# Patient Record
Sex: Female | Born: 1970 | Race: White | Hispanic: No | Smoking: Never smoker
Health system: Southern US, Community
[De-identification: ages and names within clinical notes are randomized; demographics above are authoritative.]

## PROBLEM LIST (undated history)

## (undated) DIAGNOSIS — I10 Essential (primary) hypertension: Secondary | ICD-10-CM

## (undated) DIAGNOSIS — E039 Hypothyroidism, unspecified: Secondary | ICD-10-CM

## (undated) HISTORY — PX: DILITATION & CURRETTAGE/HYSTROSCOPY WITH HYDROTHERMAL ABLATION: SHX5570

## (undated) HISTORY — PX: EYE SURGERY: SHX253

## (undated) HISTORY — DX: Hypothyroidism, unspecified: E03.9

## (undated) HISTORY — PX: IRRIGATION AND DEBRIDEMENT FOOT: SHX6602

## (undated) HISTORY — DX: Essential (primary) hypertension: I10

## (undated) HISTORY — PX: BREAST EXCISIONAL BIOPSY: SUR124

---

## 1998-06-11 ENCOUNTER — Other Ambulatory Visit: Admission: RE | Admit: 1998-06-11 | Discharge: 1998-06-11 | Payer: Self-pay | Admitting: Obstetrics & Gynecology

## 1999-06-24 ENCOUNTER — Other Ambulatory Visit: Admission: RE | Admit: 1999-06-24 | Discharge: 1999-06-24 | Payer: Self-pay | Admitting: Obstetrics & Gynecology

## 2007-10-05 DIAGNOSIS — C439 Malignant melanoma of skin, unspecified: Secondary | ICD-10-CM

## 2007-10-05 HISTORY — DX: Malignant melanoma of skin, unspecified: C43.9

## 2008-02-22 ENCOUNTER — Emergency Department (HOSPITAL_COMMUNITY): Admission: EM | Admit: 2008-02-22 | Discharge: 2008-02-22 | Payer: Self-pay | Admitting: Family Medicine

## 2012-03-22 ENCOUNTER — Other Ambulatory Visit: Payer: Self-pay | Admitting: Family Medicine

## 2012-03-22 DIAGNOSIS — C6990 Malignant neoplasm of unspecified site of unspecified eye: Secondary | ICD-10-CM

## 2012-03-28 ENCOUNTER — Ambulatory Visit
Admission: RE | Admit: 2012-03-28 | Discharge: 2012-03-28 | Disposition: A | Discharge status: Home / Self Care | Payer: BC Managed Care – PPO | Source: Ambulatory Visit | Attending: Family Medicine | Admitting: Family Medicine

## 2012-03-28 DIAGNOSIS — C6990 Malignant neoplasm of unspecified site of unspecified eye: Secondary | ICD-10-CM

## 2012-03-28 MED ORDER — GADOBENATE DIMEGLUMINE 529 MG/ML IV SOLN: 10.0000 mL | Freq: Once | INTRAVENOUS | Status: DC | PRN

## 2012-03-28 MED ORDER — GADOXETATE DISODIUM 0.25 MMOL/ML IV SOLN: 10.0000 mL | Freq: Once | INTRAVENOUS | Status: AC | PRN

## 2013-06-07 ENCOUNTER — Other Ambulatory Visit: Payer: Self-pay | Admitting: Obstetrics & Gynecology

## 2013-06-07 DIAGNOSIS — Z803 Family history of malignant neoplasm of breast: Secondary | ICD-10-CM

## 2014-08-16 ENCOUNTER — Other Ambulatory Visit: Payer: Self-pay | Admitting: Obstetrics & Gynecology

## 2014-08-16 DIAGNOSIS — R928 Other abnormal and inconclusive findings on diagnostic imaging of breast: Secondary | ICD-10-CM

## 2014-09-02 ENCOUNTER — Ambulatory Visit
Admission: RE | Admit: 2014-09-02 | Discharge: 2014-09-02 | Disposition: A | Discharge status: Home / Self Care | Payer: BC Managed Care – PPO | Source: Ambulatory Visit | Attending: Obstetrics & Gynecology | Admitting: Obstetrics & Gynecology

## 2014-09-02 DIAGNOSIS — R928 Other abnormal and inconclusive findings on diagnostic imaging of breast: Secondary | ICD-10-CM

## 2014-11-11 DIAGNOSIS — D229 Melanocytic nevi, unspecified: Secondary | ICD-10-CM

## 2014-11-11 HISTORY — DX: Melanocytic nevi, unspecified: D22.9

## 2015-09-07 IMAGING — MG MM DIGITAL DIAGNOSTIC BILAT
5 series · 5 of 5 positions shown · non-contrast
Comparison: With priors.

CLINICAL DATA: Patient was called back from screening mammogram for
possible masses in both breast.

EXAM:
DIGITAL DIAGNOSTIC  BILATERAL MAMMOGRAM WITH CAD
ULTRASOUND BILATERAL BREAST

[R MLO]
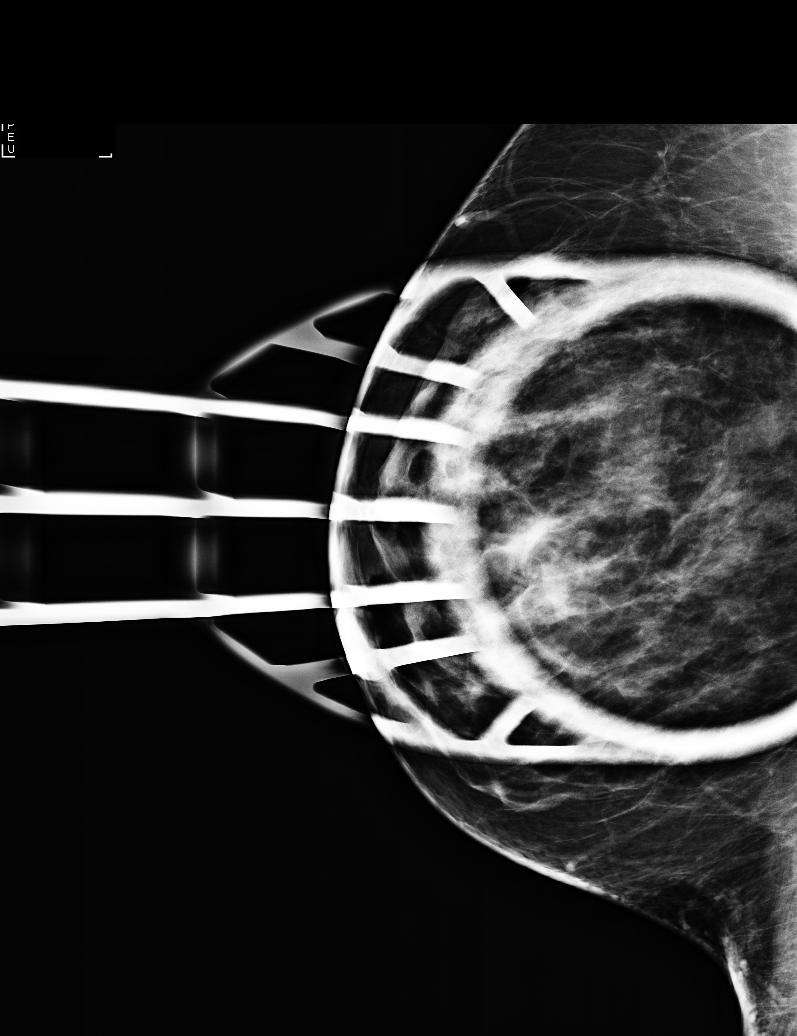

[R CC (1 of 2)]
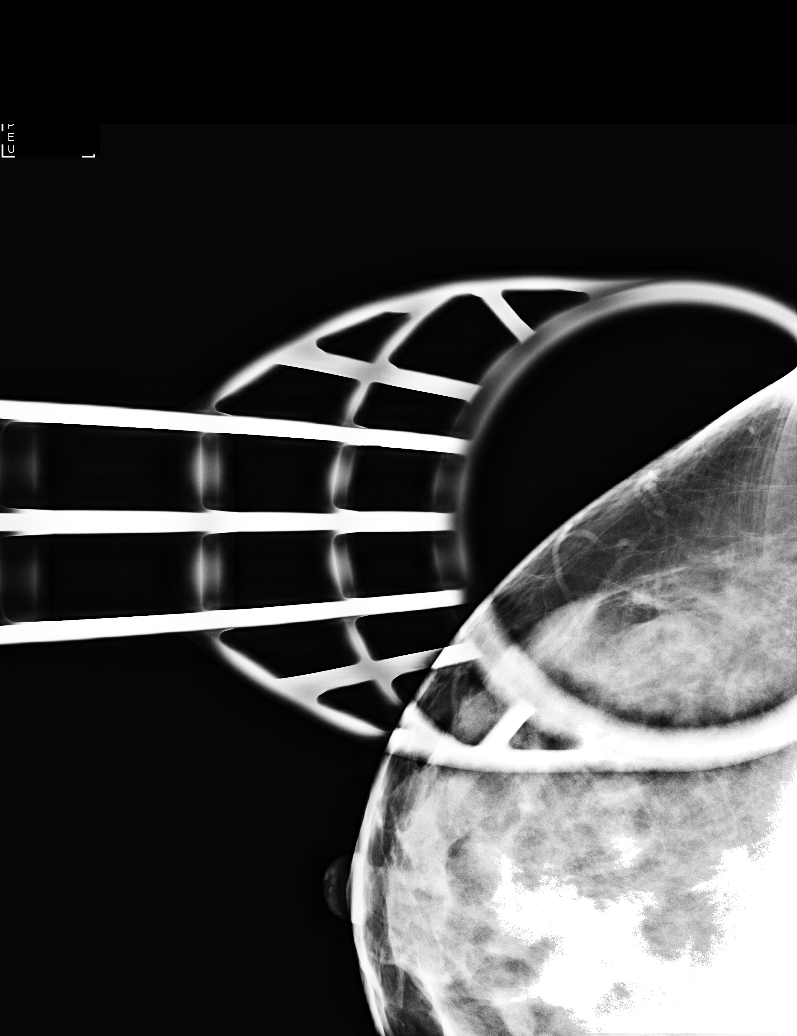

[L CC]
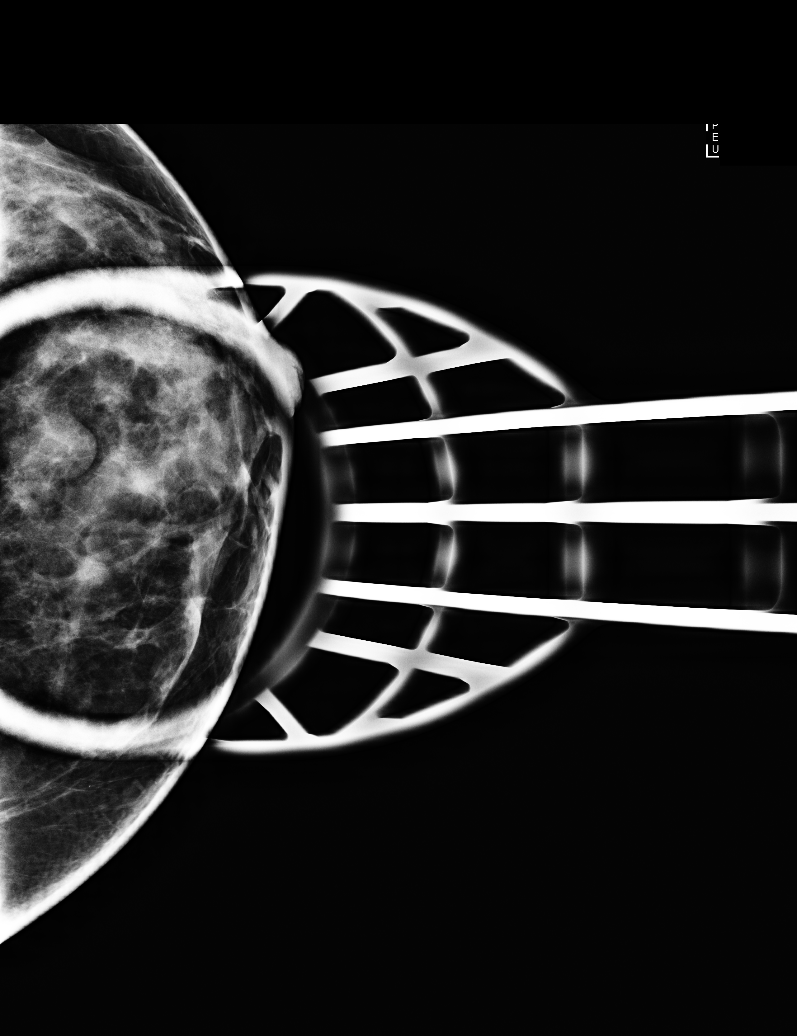

[L MLO]
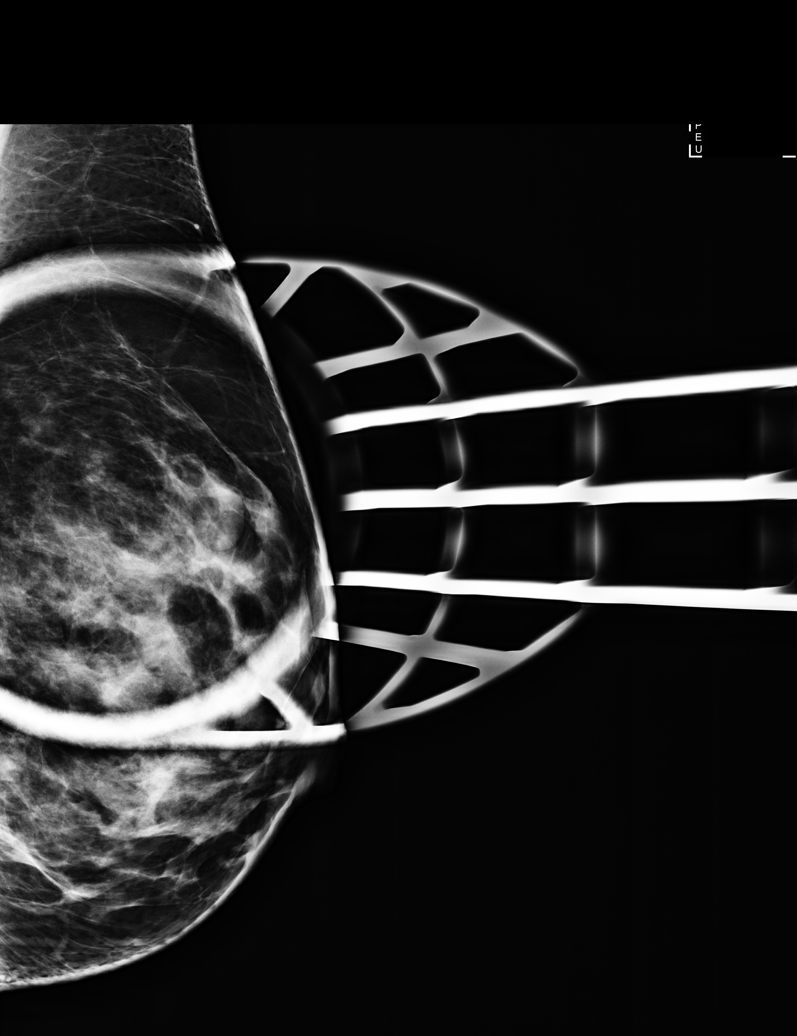

[R CC (2 of 2)]
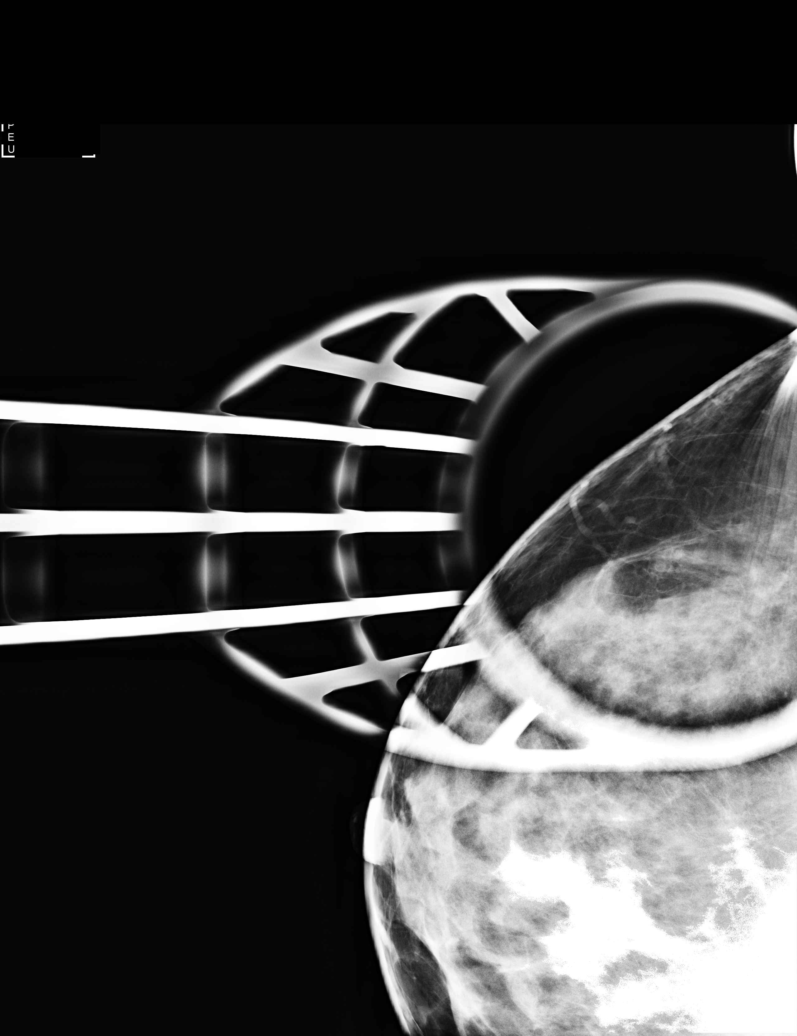

[5 of 5 positions shown; findings below may reference images not displayed]

ACR Breast Density Category b: There are scattered areas of
fibroglandular density.
FINDINGS: Additional imaging of both breast was performed. The two obscured
nodules in the lateral aspect of the right breast are not evident on
the spot compression views. There is persistence of an obscured
cm mass in the 12 o'clock region of the left breast. There are no
malignant type microcalcifications in either breast.

Mammographic images were processed with CAD.

On physical exam, I do not palpate a mass in lateral aspect of the
right breast or 12 o'clock region of the left breast.

Ultrasound is performed, showing a well-circumscribed anechoic cyst
in the right breast at 9 o'clock 4 cm from the nipple measuring 9 x
4 x 7 mm and a well-circumscribed anechoic cyst in the right breast
at 10 o'clock 4 cm from the nipple measuring 7 x 4 x 9 mm.
Sonographic evaluation of the left breast shows a well-circumscribed
anechoic cyst at 12 o'clock 2 cm from the nipple measuring 1.4 x
x 1.3 cm.
IMPRESSION: Bilateral breast cysts.  No evidence of malignancy.

RECOMMENDATION:
Bilateral screening mammogram in 1 year is recommended.

I have discussed the findings and recommendations with the patient.
Results were also provided in writing at the conclusion of the
visit. If applicable, a reminder letter will be sent to the patient
regarding the next appointment.

BI-RADS CATEGORY  2: Benign.

## 2016-03-22 DIAGNOSIS — C6990 Malignant neoplasm of unspecified site of unspecified eye: Secondary | ICD-10-CM | POA: Insufficient documentation

## 2016-03-22 DIAGNOSIS — E039 Hypothyroidism, unspecified: Secondary | ICD-10-CM | POA: Insufficient documentation

## 2016-03-22 DIAGNOSIS — C6992 Malignant neoplasm of unspecified site of left eye: Secondary | ICD-10-CM | POA: Insufficient documentation

## 2016-03-22 DIAGNOSIS — E781 Pure hyperglyceridemia: Secondary | ICD-10-CM | POA: Insufficient documentation

## 2016-03-22 DIAGNOSIS — E785 Hyperlipidemia, unspecified: Secondary | ICD-10-CM | POA: Insufficient documentation

## 2016-03-22 DIAGNOSIS — I10 Essential (primary) hypertension: Secondary | ICD-10-CM | POA: Insufficient documentation

## 2020-02-06 ENCOUNTER — Encounter (INDEPENDENT_AMBULATORY_CARE_PROVIDER_SITE_OTHER): Payer: Self-pay | Admitting: Ophthalmology

## 2020-02-06 ENCOUNTER — Ambulatory Visit (INDEPENDENT_AMBULATORY_CARE_PROVIDER_SITE_OTHER): Payer: BC Managed Care – PPO | Admitting: Ophthalmology

## 2020-02-06 ENCOUNTER — Other Ambulatory Visit: Payer: Self-pay

## 2020-02-06 DIAGNOSIS — H4720 Unspecified optic atrophy: Secondary | ICD-10-CM

## 2020-02-06 DIAGNOSIS — H25042 Posterior subcapsular polar age-related cataract, left eye: Secondary | ICD-10-CM

## 2020-02-06 DIAGNOSIS — C6932 Malignant neoplasm of left choroid: Secondary | ICD-10-CM | POA: Diagnosis not present

## 2020-02-06 DIAGNOSIS — H2513 Age-related nuclear cataract, bilateral: Secondary | ICD-10-CM

## 2020-02-06 DIAGNOSIS — H35413 Lattice degeneration of retina, bilateral: Secondary | ICD-10-CM | POA: Diagnosis not present

## 2020-02-06 DIAGNOSIS — T66XXXD Radiation sickness, unspecified, subsequent encounter: Secondary | ICD-10-CM | POA: Diagnosis not present

## 2020-02-06 DIAGNOSIS — T66XXXA Radiation sickness, unspecified, initial encounter: Secondary | ICD-10-CM | POA: Insufficient documentation

## 2020-02-06 NOTE — Patient Instructions (Signed)
Eye Floaters  Eye floaters are spots in your vision caused by shadows from specks of material that float around inside your eye. This is usually a normal, age-related change in your eye. Floaters may be more obvious when you look up at the sky or at a bright, blank background. Floaters can be annoying, but they do not usually cause vision problems. While floaters may be a normal part of aging, it is important to get an eye exam to make sure that floaters are not a sign of a more serious condition. What are the causes? In most cases, this condition is caused by age-related changes in the eye. As you age, the jelly-like fluid (vitreous) inside the eyeball shrinks and can become stringy. The stringy strands of vitreous cast shadows on the back of the eye (retina), which is where the nerves needed for vision are located. These shadows show up as floaters in your vision. Other possible causes of eye floaters include:  A torn retina.  Injury.  Bleeding inside the eye. Diabetes and other conditions can cause blood vessels in the retina to bleed.  A blood clot in the major vein of the retina or its branches (retinal vein occlusion).  Separation (detachment) of the: ? Retina. ? Vitreous.  Eye inflammation (uveitis).  Eye infection. What increases the risk? You may have a higher risk for floaters if you:  Are older. The risk increases with age.  Are nearsighted.  Have diabetes.  Have had cataracts removed. What are the signs or symptoms? Floaters cause you to see small, shadowy shapes move across your vision. The shapes move as your eyes move. They drift out of your vision when you keep your eyes still. These shapes may look like:  Specks.  Dots.  Circles.  Squiggly lines.  Thread. Sometimes floaters appear along with flashes. Flashes usually occur at the edge of your vision. They may look like:  Bursts of light.  Flashing lights.  Lightning streaks.  What is commonly  referred to as "stars." In some cases, seeing flashes can be a sign of a torn or detached retina, which is a serious condition that requires emergency treatment. How is this diagnosed? Your health care provider may diagnose floaters and flashes based on your symptoms and medical history. An eye care specialist (ophthalmologist) will do an eye exam to determine whether your floaters are a normal part of aging or a warning sign of a more serious eye problem. The specialist may put drops in your eyes to open your pupils wide (dilate) and then use a scope (slit lamp) to look inside your eye. If the specialist is unable to see well enough with a slit lamp, you may need imaging tests such as ultrasound. How is this treated?  If your floaters are the result of aging, you do not need treatment unless they start to affect your vision. Floaters do not go away completely. Most people adapt to the floaters or, over time, the floaters settle below the line of sight.  If a retinal tear or detachment is causing your floaters, you may need surgery.  If you have an underlying condition that is causing floaters, such as an infection, the floaters should go away when that condition is treated.  In rare cases, if the floaters are affecting your vision, surgery to remove the vitreous and replace it with a saltwater solution (vitrectomy) may be considered. Follow these instructions at home:  Take over-the-counter and prescription medicines only as told by your   health care provider.  Do not drive if you have trouble seeing. Ask your health care provider for guidance about when it is and is not safe for you to drive.  Keep all follow-up visits as told by your health care provider. This is important. Contact a health care provider if you:  Have more floaters than usual.  Develop any new symptoms. Get help right away if:  You cannot see well because of your floaters.  You develop flashes along with  floaters.  Your vision suddenly changes, or you lose your vision completely.  It looks as if a curtain is blocking part of your vision. Summary  Eye floaters are spots in your vision caused by specks of material that float around inside your eye.  In most cases, eye floaters are caused by age-related changes in the eye.  Most people do not need treatment for eye floaters unless there is another condition causing the floaters, such as a retinal tear or detachment or an infection. This information is not intended to replace advice given to you by your health care provider. Make sure you discuss any questions you have with your health care provider. Document Revised: 01/11/2019 Document Reviewed: 09/12/2017 Elsevier Patient Education  2020 [employer]vier Inc.  

## 2020-02-06 NOTE — Progress Notes (Signed)
02/06/2020     CHIEF COMPLAINT Patient presents for Retina Follow Up   HISTORY OF PRESENT ILLNESS: Linda Marquez is a 49 y.o. female who presents to the clinic today for:   HPI    Retina Follow Up    Patient presents with  Other.  In both eyes.  Duration of 9 months.  Since onset it is stable.          Comments    9 month follow up - FP OU Patient denies change in vision and overall has no complaints.        Last edited by Gerda Diss on 02/06/2020  8:10 AM. (History)      Referring physician: Aletha Halim., PA-C [address]  [place] [address]  HISTORICAL INFORMATION:   Selected notes from the MEDICAL RECORD NUMBER       CURRENT MEDICATIONS: Current Outpatient Medications (Ophthalmic Drugs)  Medication Sig  . timolol (TIMOPTIC) 0.5 % ophthalmic solution    No current facility-administered medications for this visit. (Ophthalmic Drugs)   Current Outpatient Medications (Other)  Medication Sig  . norethindrone-ethinyl estradiol-iron (MICROGESTIN FE 1.5/30) 1.5-30 MG-MCG tablet   . atorvastatin (LIPITOR) 40 MG tablet Take 40 mg by mouth daily.  Marland Kitchen olmesartan-hydrochlorothiazide (BENICAR HCT) 20-12.5 MG tablet Take 1 tablet by mouth daily.  Marland Kitchen SYNTHROID 200 MCG tablet Take 200 mcg by mouth daily.   No current facility-administered medications for this visit. (Other)      REVIEW OF SYSTEMS:    ALLERGIES No Known Allergies  PAST MEDICAL HISTORY Past Medical History:  Diagnosis Date  . Hypertension   . Hypothyroid    History reviewed. No pertinent surgical history.  FAMILY HISTORY Family History  Problem Relation Age of Onset  . Diabetes Mother   . Hypertension Mother   . Cataracts Maternal Grandfather     SOCIAL HISTORY Social History   Tobacco Use  . Smoking status: Never Smoker  . Smokeless tobacco: Never Used  Substance Use Topics  . Alcohol use: Not on file  . Drug use: Not on file         OPHTHALMIC  EXAM:  Base Eye Exam    Visual Acuity (Snellen - Linear)      Right Left   Dist [place] 20/20 HM       Tonometry (Tonopen, 8:19 AM)      Right Left   Pressure 9 9       Pupils      Pupils Dark Light Shape React APD   Right PERRL 5 4 Round Brisk None   Left PERRL 5 5 Round Minimal +3       Visual Fields (Counting fingers)      Left Right     Full   Restrictions Partial outer superior temporal, inferior temporal, superior nasal, inferior nasal deficiencies        Extraocular Movement      Right Left    Full Full       Neuro/Psych    Oriented x3: Yes   Mood/Affect: Normal       Dilation    Both eyes: 1.0% Mydriacyl, 2.5% Phenylephrine @ 8:19 AM        Slit Lamp and Fundus Exam    External Exam      Right Left   External Normal Normal       Slit Lamp Exam      Right Left   Lids/Lashes Normal Normal  Conjunctiva/Sclera White and quiet White and quiet   Cornea Clear Clear   Anterior Chamber Deep and quiet Deep and quiet   Iris Round and reactive Round and reactive   Lens  1+ Nuclear sclerosis, 1+ Posterior subcapsular cataract central   Anterior Vitreous Normal Normal       Fundus Exam      Right Left   Posterior Vitreous Normal Posterior vitreous detachment   Disc Normal 3+ Pallor   C/D Ratio 0.4 0.75   Macula Normal 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  Lesion now in the macula of prior small choroidal melanoma is well treated with no signs of recurrence.   Vessels Normal Indurated vessels with irregular telangiectatic vessels temporal portion of the macula.  No neovascularization is noted.   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 Macula : normal observations. Vessels : normal observations.   Left Eye Progression has been stable. Disc findings include pallor. Macula : flat.   Notes OS, small choroidal melanoma status post treatment some 11 years ago.  Ocular media opacity is responsible for overlying the posterior pole.  Upon reviewing prior patterns over the years the saddlenose and represents the posterior subcapsular cataract noted in the anterior segment examination.  The lesion on clinical examination as well as Serial photography is unchanged.                  ASSESSMENT/PLAN:  Lattice degeneration of both retinas The nature of lattice degeneration was discussed with the patient, including the increased risk of retinal breaks and retinal detachment. A discussion of the evidence regarding prophylactic laser photocoagulation for lattice degeneration to prevent retinal breaks and detachment was given. The patient was advised to return immediately for new flashes or floaters.  OU stable and no new holes or tears.      ICD-10-CM   1. 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 Radiation injury, subsequent encounter  T66.XXXD   3. 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 Lattice degeneration of both retinas  H35.413   5. Nuclear sclerotic cataract of both eyes  H25.13   6. Posterior subcapsular age-related cataract of left eye  H25.042     1.  OS with a well treated choroidal melanoma from 11 years ago.  There has been no change in size or clinical appearance.  2. No new Signs of radiation vasculitis or neovascularization of the eye left eye.  3.  Minor cataract in left eye which does not limit her visual function.  4.  No new holes or tears in either eye.  Ophthalmic Meds Ordered this visit:  No orders of the defined  types were placed in this encounter.      Return in about 9 months (around 11/08/2020) for DILATE OU, COLOR FP, B SCAN OS.  Patient Instructions  Eye Floaters  Eye floaters are spots in your vision caused by shadows from specks of material that float around inside your eye. This is usually a  normal, age-related change in your eye. Floaters may be more obvious when you look up at the sky or at a bright, blank background. Floaters can be annoying, but they do not usually cause vision problems. While floaters may be a normal part of aging, it is important to get an eye exam to make sure that floaters are not a sign of a more serious condition. What are the causes? In most cases, this condition is caused by age-related changes in the eye. As you age, the jelly-like fluid (vitreous) inside the eyeball shrinks and can become stringy. The stringy strands of vitreous cast shadows on the back of the eye (retina), which is where the nerves needed for vision are located. These shadows show up as floaters in your vision. Other possible causes of eye floaters include:  A torn retina.  Injury.  Bleeding inside the eye. Diabetes and other conditions can cause blood vessels in the retina to bleed.  A blood clot in the major vein of the retina or its branches (retinal vein occlusion).  Separation (detachment) of the: ? Retina. ? Vitreous.  Eye inflammation (uveitis).  Eye infection. What increases the risk? You may have a higher risk for floaters if you:  Are older. The risk increases with age.  Are nearsighted.  Have diabetes.  Have had cataracts removed. What are the signs or symptoms? Floaters cause you to see small, shadowy shapes move across your vision. The shapes move as your eyes move. They drift out of your vision when you keep your eyes still. These shapes may look like:  Specks.  Dots.  Circles.  Squiggly lines.  Thread. Sometimes floaters appear along with flashes.  Flashes usually occur at the edge of your vision. They may look like:  Bursts of light.  Flashing lights.  Lightning streaks.  What is commonly referred to as "stars." In some cases, seeing flashes can be a sign of a torn or detached retina, which is a serious condition that requires emergency treatment. How is this diagnosed? Your health care provider may diagnose floaters and flashes based on your symptoms and medical history. An eye care specialist (ophthalmologist) will do an eye exam to determine whether your floaters are a normal part of aging or a warning sign of a more serious eye problem. The specialist may put drops in your eyes to open your pupils wide (dilate) and then use a scope (slit lamp) to look inside your eye. If the specialist is unable to see well enough with a slit lamp, you may need imaging tests such as ultrasound. How is this treated?  If your floaters are the result of aging, you do not need treatment unless they start to affect your vision. Floaters do not go away completely. Most people adapt to the floaters or, over time, the floaters settle below the line of sight.  If a retinal tear or detachment is causing your floaters, you may need surgery.  If you have an underlying condition that is causing floaters, such as an infection, the floaters should go away when that condition is treated.  In rare cases, if the floaters are affecting your vision, surgery to remove the vitreous and replace it with a saltwater solution (vitrectomy) may be considered. Follow these instructions at home:  Take over-the-counter and prescription medicines only as told by your health care provider.  Do not drive if you have trouble seeing. Ask your health care provider for guidance about when it is and is not safe  for you to drive.  Keep all follow-up visits as told by your health care provider. This is important. Contact a health care provider if you:  Have more floaters than  usual.  Develop any new symptoms. Get help right away if:  You cannot see well because of your floaters.  You develop flashes along with floaters.  Your vision suddenly changes, or you lose your vision completely.  It looks as if a curtain is blocking part of your vision. Summary  Eye floaters are spots in your vision caused by specks of material that float around inside your eye.  In most cases, eye floaters are caused by age-related changes in the eye.  Most people do not need treatment for eye floaters unless there is another condition causing the floaters, such as a retinal tear or detachment or an infection. This information is not intended to replace advice given to you by your health care provider. Make sure you discuss any questions you have with your health care provider. Document Revised: 01/11/2019 Document Reviewed: 09/12/2017 Elsevier Patient Education  [place].     Explained the diagnoses, plan, and follow up with the patient and they expressed understanding.  Patient expressed understanding of the importance of proper follow up care.   Clent Demark Mako Pelfrey M.D. Diseases & Surgery of the Retina and Vitreous [facility] [address] 02/06/20     Abbreviations: M myopia (nearsighted); A astigmatism; H hyperopia (farsighted); P presbyopia; Mrx spectacle prescription;  CTL contact lenses; OD right eye; OS left eye; OU both eyes  XT exotropia; ET esotropia; PEK punctate epithelial keratitis; PEE punctate epithelial erosions; DES dry eye syndrome; MGD meibomian gland dysfunction; ATs artificial tears; PFAT's preservative free artificial tears; [place] nuclear sclerotic cataract; PSC posterior subcapsular cataract; ERM epi-retinal membrane; PVD posterior vitreous detachment; RD retinal detachment; DM diabetes mellitus; DR diabetic retinopathy; NPDR non-proliferative diabetic retinopathy; PDR proliferative diabetic retinopathy; CSME clinically significant macular  edema; DME diabetic macular edema; dbh dot blot hemorrhages; CWS cotton wool spot; POAG primary open angle glaucoma; C/D cup-to-disc ratio; HVF humphrey visual field; GVF goldmann visual field; OCT optical coherence tomography; IOP intraocular pressure; BRVO Branch retinal vein occlusion; CRVO central retinal vein occlusion; CRAO central retinal artery occlusion; BRAO branch retinal artery occlusion; RT retinal tear; SB scleral buckle; PPV pars plana vitrectomy; VH Vitreous hemorrhage; PRP panretinal laser photocoagulation; IVK intravitreal kenalog; VMT vitreomacular traction; MH Macular hole;  NVD neovascularization of the disc; NVE neovascularization elsewhere; AREDS age related eye disease study; ARMD age related macular degeneration; POAG primary open angle glaucoma; EBMD epithelial/anterior basement membrane dystrophy; ACIOL anterior chamber intraocular lens; IOL intraocular lens; PCIOL posterior chamber intraocular lens; Phaco/IOL phacoemulsification with intraocular lens placement; [place] photorefractive keratectomy; LASIK laser assisted in situ keratomileusis; HTN hypertension; DM diabetes mellitus; COPD chronic obstructive pulmonary disease

## 2020-02-06 NOTE — Assessment & Plan Note (Signed)
The nature of lattice degeneration was discussed with the patient, including the increased risk of retinal breaks and retinal detachment. A discussion of the evidence regarding prophylactic laser photocoagulation for lattice degeneration to prevent retinal breaks and detachment was given. The patient was advised to return immediately for new flashes or floaters.  OU stable and no new holes or tears.

## 2020-04-09 ENCOUNTER — Encounter: Payer: Self-pay | Admitting: *Deleted

## 2020-04-17 ENCOUNTER — Encounter: Payer: Self-pay | Admitting: Physician Assistant

## 2020-04-17 ENCOUNTER — Ambulatory Visit (INDEPENDENT_AMBULATORY_CARE_PROVIDER_SITE_OTHER): Payer: BC Managed Care – PPO | Admitting: Physician Assistant

## 2020-04-17 ENCOUNTER — Other Ambulatory Visit: Payer: Self-pay

## 2020-04-17 DIAGNOSIS — D229 Melanocytic nevi, unspecified: Secondary | ICD-10-CM

## 2020-04-17 DIAGNOSIS — Z1283 Encounter for screening for malignant neoplasm of skin: Secondary | ICD-10-CM

## 2020-04-17 DIAGNOSIS — C6992 Malignant neoplasm of unspecified site of left eye: Secondary | ICD-10-CM

## 2020-04-17 DIAGNOSIS — Z85840 Personal history of malignant neoplasm of eye: Secondary | ICD-10-CM | POA: Diagnosis not present

## 2020-04-17 DIAGNOSIS — Z86018 Personal history of other benign neoplasm: Secondary | ICD-10-CM

## 2020-04-17 DIAGNOSIS — D224 Melanocytic nevi of scalp and neck: Secondary | ICD-10-CM

## 2020-04-17 DIAGNOSIS — Z87898 Personal history of other specified conditions: Secondary | ICD-10-CM

## 2020-04-17 NOTE — Progress Notes (Addendum)
   Follow up Visit  Subjective  Linda Marquez is a 49 y.o. female who presents for the following: Annual Exam (History of melanoma on left eye 2009). No particular lesions that are bothering her.   Objective  Well appearing patient in no apparent distress; mood and affect are within normal limits.  A full examination was performed including scalp, head, eyes, ears, nose, lips, neck, chest, axillae, abdomen, back, buttocks, bilateral upper extremities, bilateral lower extremities, hands, feet, fingers, toes, fingernails, and toenails. All findings within normal limits unless otherwise noted below. No suspicious moles noted on back.   Objective  Left Cornea : 2009 Left eye treated in [place]  Objective  Left Shoulder - Posterior, Right Upper Back: Mild 2016, 2017  Objective  Left Temporal Scalp, Right Parietal Scalp: Tan macule left sideburn area and pink papule right scalp  Objective  Chest - Medial [facility]): Total body skin exam  Assessment & Plan  History of Ocular melanoma, left (HCC) Left Cornea   Continue eye exams q 6 months  History of atypical nevus (2) Left Shoulder - Posterior; Right Upper Back Continue yearly skin checks  Nevus (2) Right Parietal Scalp; Left Temporal Scalp  Skin exam for malignant neoplasm Chest - Medial [facility])

## 2020-11-10 ENCOUNTER — Ambulatory Visit (INDEPENDENT_AMBULATORY_CARE_PROVIDER_SITE_OTHER): Payer: BC Managed Care – PPO | Admitting: Ophthalmology

## 2020-11-10 ENCOUNTER — Other Ambulatory Visit: Payer: Self-pay

## 2020-11-10 ENCOUNTER — Encounter (INDEPENDENT_AMBULATORY_CARE_PROVIDER_SITE_OTHER): Payer: Self-pay | Admitting: Ophthalmology

## 2020-11-10 DIAGNOSIS — H35352 Cystoid macular degeneration, left eye: Secondary | ICD-10-CM | POA: Diagnosis not present

## 2020-11-10 DIAGNOSIS — H25042 Posterior subcapsular polar age-related cataract, left eye: Secondary | ICD-10-CM

## 2020-11-10 DIAGNOSIS — C6932 Malignant neoplasm of left choroid: Secondary | ICD-10-CM | POA: Diagnosis not present

## 2020-11-10 DIAGNOSIS — H2513 Age-related nuclear cataract, bilateral: Secondary | ICD-10-CM

## 2020-11-10 DIAGNOSIS — H3589 Other specified retinal disorders: Secondary | ICD-10-CM | POA: Insufficient documentation

## 2020-11-10 DIAGNOSIS — T66XXXD Radiation sickness, unspecified, subsequent encounter: Secondary | ICD-10-CM

## 2020-11-10 DIAGNOSIS — H268 Other specified cataract: Secondary | ICD-10-CM | POA: Insufficient documentation

## 2020-11-10 DIAGNOSIS — T66XXXA Radiation sickness, unspecified, initial encounter: Secondary | ICD-10-CM | POA: Insufficient documentation

## 2020-11-10 NOTE — Progress Notes (Signed)
11/10/2020     CHIEF COMPLAINT Patient presents for Retina Follow Up (9 MO FU OU, B Scan OS///Pt reports stable vision OU. Pt denies any new F/F, pain, or pressure OU. )   HISTORY OF PRESENT ILLNESS: Linda Marquez is a 50 y.o. female who presents to the clinic today for:   HPI    Retina Follow Up    Patient presents with  Other.  In left eye.  This started 9 months ago.  Duration of 9 months.  Since onset it is stable. Additional comments: 9 MO FU OU, B Scan OS   Pt reports stable vision OU. Pt denies any new F/F, pain, or pressure OU.        Last edited by [name] on 11/10/2020  8:07 AM. (History)      Referring physician: Aletha Halim., PA-C [address]  [place] [address]  HISTORICAL INFORMATION:   Selected notes from the MEDICAL RECORD NUMBER       CURRENT MEDICATIONS: Current Outpatient Medications (Ophthalmic Drugs)  Medication Sig  . timolol (TIMOPTIC) 0.5 % ophthalmic solution Place 1 drop into the left eye daily.   No current facility-administered medications for this visit. (Ophthalmic Drugs)   Current Outpatient Medications (Other)  Medication Sig  . atorvastatin (LIPITOR) 40 MG tablet Take 40 mg by mouth daily.  . norethindrone-ethinyl estradiol-iron (MICROGESTIN FE 1.5/30) 1.5-30 MG-MCG tablet   . olmesartan-hydrochlorothiazide (BENICAR HCT) 20-12.5 MG tablet Take 1 tablet by mouth daily.  Marland Kitchen SYNTHROID 200 MCG tablet Take 200 mcg by mouth daily.   No current facility-administered medications for this visit. (Other)      REVIEW OF SYSTEMS:    ALLERGIES Allergies  Allergen Reactions  . Lisinopril Cough    PAST MEDICAL HISTORY Past Medical History:  Diagnosis Date  . Atypical mole 11/11/2014   right upper back (mild) no tx   . Atypical mole 03/05/2016   left sholder (mild)  . Hypertension   . Hypothyroid   . 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 No pertinent  surgical history.  FAMILY HISTORY Family History  Problem Relation Age of Onset  . Diabetes Mother   . Hypertension Mother   . Cataracts Maternal Grandfather     SOCIAL HISTORY Social History   Tobacco Use  . Smoking status: Never Smoker  . Smokeless tobacco: Never Used         OPHTHALMIC EXAM:  Base Eye Exam    Visual Acuity (ETDRS)      Right Left   Dist  20/20 CF at 2'       Tonometry (Tonopen, 8:12 AM)      Right Left   Pressure 14 16       Pupils      Pupils Dark Light Shape React APD   Right PERRL 5 4 Round Brisk None   Left PERRL 5 5 Round Minimal +3       Visual Fields (Counting fingers)      Left Right     Full   Restrictions Partial outer superior temporal, inferior temporal, superior nasal, inferior nasal deficiencies        Extraocular Movement      Right Left    Full Full       Neuro/Psych    Oriented x3: Yes   Mood/Affect: Normal       Dilation    Both eyes: 1.0% Mydriacyl,  2.5% Phenylephrine @ 8:12 AM        Slit Lamp and Fundus Exam    External Exam      Right Left   External Normal Normal       Slit Lamp Exam      Right Left   Lids/Lashes Normal Normal   Conjunctiva/Sclera White and quiet White and quiet   Cornea Clear Clear   Anterior Chamber Deep and quiet Deep and quiet   Iris Round and reactive Round and reactive   Lens  1+ Nuclear sclerosis, 1+ Posterior subcapsular cataract central   Anterior Vitreous Normal Normal       Fundus Exam      Right Left   Posterior Vitreous Normal Posterior vitreous detachment   Disc Normal 3+ Pallor   C/D Ratio 0.4 0.75   Macula Normal 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  Lesion now in the macula of prior small choroidal melanoma is well treated with no signs of recurrence.   Vessels Normal Indurated vessels with irregular telangiectatic vessels temporal portion of the macula.  No  neovascularization is noted.   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 Macula : normal observations. Vessels : normal observations.   Left Eye Progression has been stable. Disc findings include pallor, thinning of rim. Macula : flat.   Notes OS, small choroidal melanoma status post treatment some 12 years ago.   The lesion on clinical examination as well as Serial photography is unchanged.  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         OCT, Retina - OU - Both Eyes       Right Eye Quality was good. Scan locations included subfoveal. Central Foveal Thickness: 306. Progression has been stable. Findings include normal foveal contour.   Left Eye Quality was good. Scan locations included subfoveal. Central Foveal Thickness: 717. Progression has worsened. Findings include cystoid macular edema.                 ASSESSMENT/PLAN:  Choroidal malignant melanoma, left (HCC) Now 12 years status post treatment via external plaque radiation of choroidal melanoma posteriorly left eye.  Treatment progression pattern continues to be the same.  Previous radiation retinopathy treated with peripheral PRP status post steroid injection, 2010 -2011 timeframe.  No active neovascular disease at this time.  Macular findings clinically and on OCT confirm residual cystoid macular edema in the right eye with areas of macular nonperfusion, white lines of nonperfusion, micro aneurysmal changes at and temporal to the  fovea.  These areas in my opinion do not require intervention at this time.  We will continue to observe  Patient followed in [facility], [facility], Dr. Shawn Stall.  Last examination November 2021 at which time B-scan ultrasonography performed and serial examination with no interval changes  The left eye does have significant macular nonperfusion I discussed with the patient sometimes we will treat these areas with laser photocoagulation to ablate these  areas to prevent adjacent areas of neovascularization developing.  Posterior subcapsular age-related cataract of left eye Minor observe no intervention required  Nuclear sclerotic cataract of both eyes Minor, no visual impact currently  Cystoid macular edema, left eye Likely secondary to previous radiation, will observe as poor visual acuity prognosis      ICD-10-CM   1. Choroidal malignant melanoma, left (HCC)  C69.32 Color Fundus Photography Optos - OU - Both Eyes    OCT, Retina - OU - Both Eyes    CANCELED: B-Scan Ultrasound - OS - Left Eye  2. Cystoid macular edema, left eye  H35.352 OCT, Retina - OU - Both Eyes  3. Post-radiation retinopathy, subsequent encounter  T66.XXXD OCT, Retina - OU - Both Eyes   H35.89   4. Radiation induced cataract  H26.8   5. Posterior subcapsular age-related cataract of left eye  H25.042   6. Nuclear sclerotic cataract of both eyes  H25.13     1.  We will monitor CME in the left eye, I discussed possible laser treatment to nonperfused areas in the posterior pole simply to prevent progression of CME and/or trigger local neovascular response of the retina  2.  No change in tumor regression pattern  3.  Ophthalmic Meds Ordered this visit:  No orders of the defined types were placed in this encounter.      Return in about 5 months (around 04/09/2021) for COLOR FP, DILATE OU, OCT.  Patient Instructions  Patient continues with medical follow-up, liver function test measured by Dr.  Deatra Ina, typically every May.  Normal in the past,  Schedule for repeat examination with Dr. Deatra Ina and liver function test in May 2022      Explained the diagnoses, plan, and follow up with the patient and they expressed understanding.  Patient expressed understanding of the importance of proper follow up care.   Clent Demark Chazlyn Cude M.D. Diseases & Surgery of the Retina and Vitreous Retina & Diabetic [place] 11/10/20     Abbreviations: M myopia (nearsighted); A astigmatism; H hyperopia (farsighted); P presbyopia; Mrx spectacle prescription;  CTL contact lenses; OD right eye; OS left eye; OU both eyes  XT exotropia; ET esotropia; PEK punctate epithelial keratitis; PEE punctate epithelial erosions; DES dry eye syndrome; MGD meibomian gland dysfunction; ATs artificial tears; PFAT's preservative free artificial tears; [place] nuclear sclerotic cataract; PSC posterior subcapsular cataract; ERM epi-retinal membrane; PVD posterior vitreous detachment; RD retinal detachment; DM diabetes mellitus; DR diabetic retinopathy; NPDR non-proliferative diabetic retinopathy; PDR proliferative diabetic retinopathy; CSME clinically significant macular edema; DME diabetic macular edema; dbh dot blot hemorrhages; CWS cotton wool spot; POAG primary open angle glaucoma; C/D cup-to-disc ratio; HVF humphrey visual field; GVF goldmann visual field; OCT optical coherence tomography; IOP intraocular pressure; BRVO Branch retinal vein occlusion; CRVO central retinal vein occlusion; CRAO central retinal artery occlusion; BRAO branch retinal artery occlusion; RT retinal tear; SB scleral buckle; PPV pars plana vitrectomy; VH Vitreous hemorrhage; PRP panretinal laser photocoagulation; IVK intravitreal kenalog; VMT vitreomacular traction; MH Macular hole;  NVD neovascularization of the disc; NVE neovascularization elsewhere; AREDS age related eye disease study; ARMD age related macular degeneration; POAG primary open angle glaucoma; EBMD  epithelial/anterior basement membrane dystrophy; ACIOL anterior chamber intraocular lens; IOL intraocular lens; PCIOL posterior chamber intraocular lens; Phaco/IOL phacoemulsification with intraocular lens placement; [place] photorefractive keratectomy; LASIK laser assisted in situ keratomileusis; HTN hypertension; DM diabetes mellitus; COPD chronic obstructive pulmonary disease

## 2020-11-10 NOTE — Assessment & Plan Note (Signed)
Minor, no visual impact currently

## 2020-11-10 NOTE — Patient Instructions (Addendum)
Patient continues with medical follow-up, liver function test measured by Dr. Deatra Ina, typically every May.  Normal in the past,  Schedule for repeat examination with Dr. Deatra Ina and liver function test in May 2022

## 2020-11-10 NOTE — Assessment & Plan Note (Signed)
Minor observe no intervention required

## 2020-11-10 NOTE — Assessment & Plan Note (Signed)
Likely secondary to previous radiation, will observe as poor visual acuity prognosis

## 2020-11-10 NOTE — Assessment & Plan Note (Addendum)
Now 12 years status post treatment via external plaque radiation of choroidal melanoma posteriorly left eye.  Treatment progression pattern continues to be the same.  Previous radiation retinopathy treated with peripheral PRP status post steroid injection, 2010 -2011 timeframe.  No active neovascular disease at this time.  Macular findings clinically and on OCT confirm residual cystoid macular edema in the right eye with areas of macular nonperfusion, white lines of nonperfusion, micro aneurysmal changes at and temporal to the fovea.  These areas in my opinion do not require intervention at this time.  We will continue to observe  Patient followed in [facility], [facility], Dr. Shawn Stall.  Last examination November 2021 at which time B-scan ultrasonography performed and serial examination with no interval changes  The left eye does have significant macular nonperfusion I discussed with the patient sometimes we will treat these areas with laser photocoagulation to ablate these areas to prevent adjacent areas of neovascularization developing.

## 2021-01-27 LAB — COLOGUARD: COLOGUARD: NEGATIVE

## 2021-04-07 ENCOUNTER — Other Ambulatory Visit (INDEPENDENT_AMBULATORY_CARE_PROVIDER_SITE_OTHER): Payer: Self-pay | Admitting: Ophthalmology

## 2021-04-09 ENCOUNTER — Encounter (INDEPENDENT_AMBULATORY_CARE_PROVIDER_SITE_OTHER): Payer: BC Managed Care – PPO | Admitting: Ophthalmology

## 2021-04-20 ENCOUNTER — Ambulatory Visit: Payer: BC Managed Care – PPO | Admitting: Physician Assistant

## 2021-05-07 ENCOUNTER — Other Ambulatory Visit: Payer: Self-pay

## 2021-05-07 ENCOUNTER — Encounter (INDEPENDENT_AMBULATORY_CARE_PROVIDER_SITE_OTHER): Payer: Self-pay | Admitting: Ophthalmology

## 2021-05-07 ENCOUNTER — Ambulatory Visit (INDEPENDENT_AMBULATORY_CARE_PROVIDER_SITE_OTHER): Payer: BC Managed Care – PPO | Admitting: Ophthalmology

## 2021-05-07 DIAGNOSIS — H35352 Cystoid macular degeneration, left eye: Secondary | ICD-10-CM

## 2021-05-07 DIAGNOSIS — T66XXXD Radiation sickness, unspecified, subsequent encounter: Secondary | ICD-10-CM

## 2021-05-07 DIAGNOSIS — C6932 Malignant neoplasm of left choroid: Secondary | ICD-10-CM

## 2021-05-07 DIAGNOSIS — H35413 Lattice degeneration of retina, bilateral: Secondary | ICD-10-CM

## 2021-05-07 DIAGNOSIS — C6992 Malignant neoplasm of unspecified site of left eye: Secondary | ICD-10-CM

## 2021-05-07 DIAGNOSIS — H3589 Other specified retinal disorders: Secondary | ICD-10-CM

## 2021-05-07 NOTE — Assessment & Plan Note (Signed)
No new retinal breaks OU peripherally

## 2021-05-07 NOTE — Progress Notes (Signed)
05/07/2021     CHIEF COMPLAINT Patient presents for Retina Follow Up (5 month fu OU and oct/fp/Pt states [facility] OU stable since last visit. Pt denies FOL, floaters, or ocular pain OU. /Pt reports using Timolol QDAY OS/)   HISTORY OF PRESENT ILLNESS: Linda Marquez is a 50 y.o. female who presents to the clinic today for:   HPI     Retina Follow Up           Diagnosis: Other   Laterality: both eyes   Onset: 5 months ago   Severity: mild   Duration: 5 months   Course: stable   Comments: 5 month fu OU and oct/fp Pt states VA OU stable since last visit. Pt denies FOL, floaters, or ocular pain OU.  Pt reports using Timolol QDAY OS        Last edited by Kendra Opitz, COA on 05/07/2021  8:19 AM.      Referring physician: Aletha Halim., PA-C [address]  [place] [address]  HISTORICAL INFORMATION:   Selected notes from the MEDICAL RECORD NUMBER       CURRENT MEDICATIONS: Current Outpatient Medications (Ophthalmic Drugs)  Medication Sig   timolol (TIMOPTIC) 0.5 % ophthalmic solution INSTILL 1 DROP IN LEFT EYE DAILY   No current facility-administered medications for this visit. (Ophthalmic Drugs)   Current Outpatient Medications (Other)  Medication Sig   atorvastatin (LIPITOR) 40 MG tablet Take 40 mg by mouth daily.   norethindrone-ethinyl estradiol-iron (MICROGESTIN FE 1.5/30) 1.5-30 MG-MCG tablet    olmesartan-hydrochlorothiazide (BENICAR HCT) 20-12.5 MG tablet Take 1 tablet by mouth daily.   SYNTHROID 200 MCG tablet Take 200 mcg by mouth daily.   No current facility-administered medications for this visit. (Other)      REVIEW OF SYSTEMS:    ALLERGIES Allergies  Allergen Reactions   Lisinopril Cough    PAST MEDICAL HISTORY Past Medical History:  Diagnosis Date   Atypical mole 11/11/2014   right upper back (mild) no tx    Atypical mole 03/05/2016   left sholder (mild)   Hypertension    Hypothyroid    Melanoma ([place]) 2009   Occular  melanoma left eye (tx in [place])   History reviewed. No pertinent surgical history.  FAMILY HISTORY Family History  Problem Relation Age of Onset   Diabetes Mother    Hypertension Mother    Cataracts Maternal Grandfather     SOCIAL HISTORY Social History   Tobacco Use   Smoking status: Never   Smokeless tobacco: Never         OPHTHALMIC EXAM:  Base Eye Exam     Visual Acuity (ETDRS)       Right Left   Dist cc 20/20 CF at 3'         Tonometry (Tonopen, 8:22 AM)       Right Left   Pressure 13 13         Pupils       Dark Light Shape React APD   Right 5 4 Round Brisk None   Left 5 5 Round Minimal +2         Visual Fields       Left Right     Full   Restrictions Partial outer superior temporal, inferior temporal, superior nasal, inferior nasal deficiencies          Extraocular Movement       Right Left    Full Full  Neuro/Psych     Oriented x3: Yes   Mood/Affect: Normal         Dilation     Both eyes: 1.0% Mydriacyl, 2.5% Phenylephrine @ 8:22 AM           Slit Lamp and Fundus Exam     External Exam       Right Left   External Normal Normal         Slit Lamp Exam       Right Left   Lids/Lashes Normal Normal   Conjunctiva/Sclera White and quiet White and quiet   Cornea Clear Clear   Anterior Chamber Deep and quiet Deep and quiet   Iris Round and reactive Round and reactive   Lens 1+ Nuclear sclerosis 1+ Nuclear sclerosis, 1+ Posterior subcapsular cataract central   Anterior Vitreous Normal Normal         Fundus Exam       Right Left   Posterior Vitreous Normal Posterior vitreous detachment   Disc Normal 3+ Pallor   C/D Ratio 0.4 0.75   Macula Normal 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  Lesion now in the macula of prior small choroidal melanoma is well treated with no signs of recurrence.   Vessels  Normal Attenuated vessels with irregular telangiectatic vessels temporal portion of the macula.  No neovascularization is noted.   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 Scan locations included subfoveal. Central Foveal Thickness: 306. Progression has been stable. Findings include normal foveal contour.   Left Eye Quality was good. Scan locations included subfoveal. Central Foveal Thickness: 717. Progression has worsened. Findings include cystoid macular edema.   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  This is obviously ischemic with attenuated, atrophic retinal tissue, not amenable to therapy.  Stable overall     Color Fundus Photography Optos - OU - Both Eyes       Right Eye Disc findings include normal observations. Macula : normal observations. Vessels : normal observations.   Left Eye Progression has been stable. Disc findings include pallor, thinning of rim. Macula : flat.   Notes OS, small choroidal melanoma status post treatment some 14 years ago.  Still in regression pattern, small area on the superior edge of the chorioretinal scar with no apparent thickness and no change in opacity.  Posterior subcapsular medial opacity in the central line of view of the camera is not a change in the choroidal features   The lesion on clinical examination as well as Serial photography is unchanged.  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                ASSESSMENT/PLAN:  Malignant melanoma of eye ([place]) Status post plaque therapy some 14 years previous.  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 Choroidal malignant melanoma, left (HCC)  C69.32 Color Fundus Photography Optos - OU - Both Eyes    2. Cystoid macular edema, left eye  H35.352 OCT, Retina - OU - Both Eyes    3. Lattice degeneration of both retinas  H35.413 OCT, Retina - OU - Both Eyes    4. Malignant melanoma of left eye ([place])  C69.92     5. Post-radiation retinopathy, subsequent encounter  T66.XXXD    H35.89       1.  OS looks great, melanoma of the choroid still in a regression pattern.  There is no new findings either eye.  2.  Patient continues with excellent compliance with wearing safety glasses and every day encounters and events  3.  Ophthalmic Meds Ordered this visit:  No orders of the defined types were placed in this encounter.      Return in about 7 months (around 12/05/2021) for DILATE OU, COLOR FP, OCT, OD, OS.  There are no Patient Instructions on file for this visit.   Explained the diagnoses, plan, and follow up with the patient and they expressed understanding.  Patient expressed understanding of the importance of proper follow up care.   Clent Demark Jaimeson Gopal M.D. Diseases & Surgery of the Retina and Vitreous Retina & Diabetic [place] 05/07/21     Abbreviations: M myopia (nearsighted); A astigmatism; H hyperopia (farsighted); P presbyopia; Mrx spectacle prescription;  CTL contact lenses; OD right eye; OS left eye; OU both eyes  XT exotropia; ET esotropia; PEK punctate epithelial keratitis;  PEE punctate epithelial erosions; DES dry eye syndrome; MGD meibomian gland dysfunction; ATs artificial tears; PFAT's preservative free artificial tears; [place] nuclear sclerotic cataract; PSC posterior subcapsular cataract; ERM epi-retinal membrane; PVD posterior vitreous detachment; RD retinal detachment; DM diabetes mellitus; DR diabetic retinopathy; NPDR non-proliferative diabetic retinopathy; PDR proliferative diabetic retinopathy; CSME clinically significant macular edema; DME diabetic macular edema; dbh dot blot hemorrhages; CWS cotton wool spot; POAG primary open angle glaucoma; C/D cup-to-disc ratio; HVF humphrey visual field; GVF goldmann visual field; OCT optical coherence tomography; IOP intraocular pressure; BRVO Branch retinal vein occlusion; CRVO central retinal vein occlusion; CRAO central retinal artery occlusion; BRAO branch retinal artery occlusion; RT retinal tear; SB scleral buckle; PPV pars plana vitrectomy; VH Vitreous hemorrhage; PRP panretinal laser photocoagulation; IVK intravitreal kenalog; VMT vitreomacular traction; MH Macular hole;  NVD neovascularization of the disc; NVE neovascularization elsewhere; AREDS age related eye disease study; ARMD age related macular degeneration; POAG primary open angle glaucoma; EBMD epithelial/anterior basement membrane dystrophy; ACIOL anterior chamber intraocular lens; IOL intraocular lens; PCIOL posterior chamber intraocular lens; Phaco/IOL phacoemulsification with intraocular lens placement; [place] photorefractive keratectomy; LASIK laser assisted in situ keratomileusis; HTN hypertension; DM diabetes mellitus; COPD chronic obstructive pulmonary disease

## 2021-05-07 NOTE — Assessment & Plan Note (Signed)
Status post plaque therapy some 14 years previous.  Stable over time progression pattern of lesion no change overall

## 2021-05-07 NOTE — Assessment & Plan Note (Signed)
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

## 2021-05-07 NOTE — Assessment & Plan Note (Signed)
Left eye stable over time, good PRP

## 2021-12-07 ENCOUNTER — Other Ambulatory Visit: Payer: Self-pay

## 2021-12-07 ENCOUNTER — Encounter (INDEPENDENT_AMBULATORY_CARE_PROVIDER_SITE_OTHER): Payer: Self-pay | Admitting: Ophthalmology

## 2021-12-07 ENCOUNTER — Ambulatory Visit (INDEPENDENT_AMBULATORY_CARE_PROVIDER_SITE_OTHER): Payer: BC Managed Care – PPO | Admitting: Ophthalmology

## 2021-12-07 DIAGNOSIS — H35352 Cystoid macular degeneration, left eye: Secondary | ICD-10-CM

## 2021-12-07 DIAGNOSIS — C6992 Malignant neoplasm of unspecified site of left eye: Secondary | ICD-10-CM | POA: Diagnosis not present

## 2021-12-07 DIAGNOSIS — C6932 Malignant neoplasm of left choroid: Secondary | ICD-10-CM | POA: Diagnosis not present

## 2021-12-07 DIAGNOSIS — H3522 Other non-diabetic proliferative retinopathy, left eye: Secondary | ICD-10-CM | POA: Diagnosis not present

## 2021-12-07 DIAGNOSIS — H2513 Age-related nuclear cataract, bilateral: Secondary | ICD-10-CM

## 2021-12-07 DIAGNOSIS — H4720 Unspecified optic atrophy: Secondary | ICD-10-CM

## 2021-12-07 NOTE — Assessment & Plan Note (Signed)
OS looks great, no reactive disease ?

## 2021-12-07 NOTE — Assessment & Plan Note (Signed)
OS looks great, still with regression pattern status post plaque radiotherapy. ?

## 2021-12-07 NOTE — Assessment & Plan Note (Signed)
OS stable over time no change ?

## 2021-12-07 NOTE — Assessment & Plan Note (Signed)
Minor peripapillary edema has subsided since last visit by OCT evaluation as compared to August 2022 ?

## 2021-12-07 NOTE — Assessment & Plan Note (Signed)
OS stable over time no recurrence of disease ?

## 2021-12-07 NOTE — Progress Notes (Signed)
12/07/2021     CHIEF COMPLAINT Patient presents for  Chief Complaint  Patient presents with   Retina Follow Up      HISTORY OF PRESENT ILLNESS: Linda Marquez is a 51 y.o. female who presents to the clinic today for:   HPI     Retina Follow Up           Diagnosis: Other (Choroidal Malignant Melanoma)   Laterality: left eye   Onset: 7 months ago         Comments   Patient states vision is stable and unchanged since last visit. Denies any new floaters or FOL. Pt is using Timolol QD OS.      Last edited by Laurin Coder on 12/07/2021  8:00 AM.      Referring physician: Aletha Halim., PA-C [address]  [place] [address]  HISTORICAL INFORMATION:   Selected notes from the MEDICAL RECORD NUMBER       CURRENT MEDICATIONS: Current Outpatient Medications (Ophthalmic Drugs)  Medication Sig   timolol (TIMOPTIC) 0.5 % ophthalmic solution INSTILL 1 DROP IN LEFT EYE DAILY   No current facility-administered medications for this visit. (Ophthalmic Drugs)   Current Outpatient Medications (Other)  Medication Sig   atorvastatin (LIPITOR) 40 MG tablet Take 40 mg by mouth daily.   norethindrone-ethinyl estradiol-iron (MICROGESTIN FE 1.5/30) 1.5-30 MG-MCG tablet    olmesartan-hydrochlorothiazide (BENICAR HCT) 20-12.5 MG tablet Take 1 tablet by mouth daily.   SYNTHROID 200 MCG tablet Take 200 mcg by mouth daily.   No current facility-administered medications for this visit. (Other)      REVIEW OF SYSTEMS: ROS   Negative for: Constitutional, Gastrointestinal, Neurological, Skin, Genitourinary, Musculoskeletal, HENT, Endocrine, Cardiovascular, Eyes, Respiratory, Psychiatric, Allergic/Imm, Heme/Lymph Last edited by Hurman Horn, MD on 12/07/2021  9:15 AM.       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 No pertinent surgical history.  FAMILY HISTORY Family History  Problem Relation Age of Onset   Diabetes Mother    Hypertension Mother    Cataracts Maternal Grandfather     SOCIAL HISTORY Social History   Tobacco Use   Smoking status: Never   Smokeless tobacco: Never         OPHTHALMIC EXAM:  Base Eye Exam     Visual Acuity (ETDRS)       Right Left   Dist [place] 20/20 HM @ 3 ft   Dist ph [place]  NI         Tonometry (Tonopen, 8:03 AM)       Right Left   Pressure 17 15         Pupils       Pupils Dark Light Shape React APD   Right PERRL 5 4 Round Brisk None   Left PERRL 5 4 Round Brisk +2         Visual Fields (Counting fingers)       Left Right     Full   Restrictions Total inferior temporal, superior nasal, inferior nasal deficiencies; Partial outer superior temporal deficiency  Extraocular Movement       Right Left    Full Full         Neuro/Psych     Oriented x3: Yes   Mood/Affect: Normal         Dilation     Both eyes: 1.0% Mydriacyl, 2.5% Phenylephrine @ 8:03 AM           Slit Lamp and Fundus Exam     External Exam       Right Left   External Normal Normal         Slit Lamp Exam       Right Left   Lids/Lashes Normal Normal   Conjunctiva/Sclera White and quiet White and quiet   Cornea Clear Clear   Anterior Chamber Deep and quiet Deep and quiet   Iris Round and reactive Round and reactive   Lens 1+ Nuclear sclerosis 1+ Nuclear sclerosis, 1+ Posterior subcapsular cataract central   Anterior Vitreous Normal Normal         Fundus Exam       Right Left   Posterior Vitreous Normal Posterior vitreous detachment   Disc Normal 3+ Pallor   C/D Ratio 0.4 0.75   Macula Normal Old choroidal atrophy of the macular region as well as RPE  atrophy as a consequence of prior radiation retinopathy now quiesced sent.  Lesion now in the macula of prior small choroidal melanoma is well treated with no signs of recurrence.Minor peripapillary CME remains today   Vessels Normal Attenuated vessels with irregular telangiectatic vessels temporal portion of the macula.  No neovascularization is noted.   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 Scan locations included subfoveal. Central Foveal Thickness: 301. Progression has been stable. Findings include normal foveal contour.   Left Eye Quality was good. Scan locations included subfoveal. Central Foveal Thickness: 291. Progression has worsened. Findings include cystoid macular edema.   Notes Much less active disease, peripapillary will observe OS     Color Fundus Photography Optos - OU - Both Eyes       Right Eye Disc findings include normal observations. Macula : normal observations. Vessels : normal observations.   Left Eye Progression has been stable. Disc findings include pallor, thinning of rim. Macula : flat.   Notes OS, small choroidal melanoma status post treatment some 14 years ago.  Still in regression pattern, small area on the superior edge of the chorioretinal scar with no apparent thickness and no change in opacity.  Posterior subcapsular medial opacity in the central line of view of the camera is not a change in the choroidal features   The lesion on clinical examination as well as Serial photography is unchanged.  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  No evidence of progression of radiation retinopathy, no active disease               ASSESSMENT/PLAN:  Malignant melanoma of left eye ([place]) OS looks great, still with regression pattern status post plaque radiotherapy.  Nondiabetic proliferative retinopathy of left eye OS looks great, no  reactive disease  Cystoid macular edema, left eye Minor peripapillary edema has subsided since last visit by OCT evaluation as compared to August 2022  Choroidal malignant melanoma, left (HCC) OS stable over time no recurrence of disease  Optic disc atrophy, left OS stable over time no change  Nuclear sclerotic cataract of both eyes Mild not visually significant OD, or OS   With monocular status patient does continue to wear safety glasses at the appropriate times if not always     ICD-10-CM   1. Choroidal malignant melanoma, left (HCC)  C69.32 OCT, Retina - OU - Both Eyes    Color Fundus Photography Optos - OU - Both Eyes    2. Malignant melanoma of left eye ([place])  C69.92     3. Nondiabetic proliferative retinopathy of left eye  H35.22     4. Cystoid macular edema, left eye  H35.352     5. Optic disc atrophy, left  H47.20     6. Nuclear sclerotic cataract of both eyes  H25.13       1.  Patient has returned scheduled follow-up to [facility], oncology service November 2023,  2.  No change in clinical pattern to the regressed choroidal melanoma of the left eye, no signs of regrowth,  3.  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 4.  No progression of lens changes in the left eye.  Ophthalmic Meds Ordered this visit:  No orders of the defined types were placed in this encounter.      Return in about 1 year (around 12/08/2022) for DILATE OU, COLOR FP, OCT.  There are no Patient Instructions on file for this visit.   Explained the diagnoses, plan, and follow  up with the patient and they expressed understanding.  Patient expressed understanding of the importance of proper follow up care.   Clent Demark Idalis Hoelting M.D. Diseases & Surgery of the Retina and Vitreous [facility] 12/07/21     Abbreviations: M myopia (nearsighted); A astigmatism; H hyperopia (farsighted); P presbyopia; Mrx spectacle prescription;  CTL contact lenses; OD right eye; OS left eye; OU both eyes  XT exotropia; ET esotropia; PEK punctate epithelial keratitis; PEE punctate epithelial erosions; DES dry eye syndrome; MGD meibomian gland dysfunction; ATs artificial tears; PFAT's preservative free artificial tears; [place] nuclear sclerotic cataract; PSC posterior subcapsular cataract; ERM epi-retinal membrane; PVD posterior vitreous detachment; RD retinal detachment; DM diabetes mellitus; DR diabetic retinopathy; NPDR non-proliferative diabetic retinopathy; PDR proliferative diabetic retinopathy; CSME clinically significant macular edema; DME diabetic macular edema; dbh dot blot hemorrhages; CWS cotton wool spot; POAG primary open angle glaucoma; C/D cup-to-disc ratio; HVF humphrey visual field; GVF goldmann visual field; OCT optical coherence tomography; IOP intraocular pressure; BRVO Branch retinal vein occlusion; CRVO central retinal vein occlusion; CRAO central retinal artery occlusion; BRAO branch retinal artery occlusion; RT retinal tear; SB scleral buckle; PPV pars plana vitrectomy; VH Vitreous hemorrhage; PRP panretinal laser photocoagulation; IVK intravitreal kenalog; VMT vitreomacular traction; MH Macular hole;  NVD neovascularization of the disc; NVE neovascularization elsewhere; AREDS age related eye disease study; ARMD age related macular degeneration; POAG primary open angle glaucoma; EBMD epithelial/anterior basement membrane dystrophy; ACIOL anterior chamber intraocular lens; IOL intraocular lens; PCIOL posterior chamber intraocular lens; Phaco/IOL phacoemulsification with  intraocular lens placement; [place] photorefractive keratectomy; LASIK laser assisted in situ keratomileusis; HTN hypertension; DM diabetes mellitus; COPD chronic obstructive pulmonary disease

## 2021-12-07 NOTE — Assessment & Plan Note (Addendum)
Mild not visually significant OD, or OS ? ? ?With monocular status patient does continue to wear safety glasses at the appropriate times if not always ?

## 2022-01-05 ENCOUNTER — Encounter: Payer: Self-pay | Admitting: Dermatology

## 2022-01-05 ENCOUNTER — Ambulatory Visit (INDEPENDENT_AMBULATORY_CARE_PROVIDER_SITE_OTHER): Payer: BC Managed Care – PPO | Admitting: Dermatology

## 2022-01-05 DIAGNOSIS — L821 Other seborrheic keratosis: Secondary | ICD-10-CM

## 2022-01-05 DIAGNOSIS — Z1283 Encounter for screening for malignant neoplasm of skin: Secondary | ICD-10-CM | POA: Diagnosis not present

## 2022-01-05 DIAGNOSIS — Z85820 Personal history of malignant melanoma of skin: Secondary | ICD-10-CM

## 2022-01-05 DIAGNOSIS — Z85840 Personal history of malignant neoplasm of eye: Secondary | ICD-10-CM | POA: Diagnosis not present

## 2022-01-05 DIAGNOSIS — Z86018 Personal history of other benign neoplasm: Secondary | ICD-10-CM

## 2022-01-27 ENCOUNTER — Encounter: Payer: Self-pay | Admitting: Dermatology

## 2022-01-27 NOTE — Progress Notes (Signed)
? ?  Follow-Up Visit ?  ?Subjective  ?Linda Marquez is a 51 y.o. female who presents for the following: Annual Exam (Annual skin exam. No concerns. Personal history of melanoma and atypical moles. ). ? ?General skin examination ?Location:  ?Duration:  ?Quality:  ?Associated Signs/Symptoms: ?Modifying Factors:  ?Severity:  ?Timing: ?Context:  ? ?Objective  ?Well appearing patient in no apparent distress; mood and affect are within normal limits. ?Full body skin exam.  No atypical pigmented lesions (all checked with dermoscopy).  No nonmelanoma skin cancer. ? ?Left Abdomen (side) - Upper, Left Lower Leg - Anterior, Right Lower Leg - Anterior ?Multiple flattopped tan textured 3 to 6 mm papules, all with typical dermoscopy ? ?Left Cornea ?I discussed in some detail the association of ocular melanoma with a small increased risk of cutaneous melanoma as well as the distinctions between the 2. ? ? ? ?A full examination was performed including scalp, head, eyes, ears, nose, lips, neck, chest, axillae, abdomen, back, buttocks, bilateral upper extremities, bilateral lower extremities, hands, feet, fingers, toes, fingernails, and toenails. All findings within normal limits unless otherwise noted below.  Is beneath undergarments not fully examined. ? ? ?Assessment & Plan  ? ? ?Seborrheic keratosis (3) ?Left Lower Leg - Anterior; Right Lower Leg - Anterior; Left Abdomen (side) - Upper ? ?Leave if stable ? ?Screening for malignant neoplasm of skin ? ?Annual skin examination. ? ?History of malignant melanoma of eye ?Left Cornea ? ?Encouraged annual complete skin examination plus twice annual self examination.  Continued ultraviolet protection. ? ? ? ? ? ?I, Lavonna Monarch, MD, have reviewed all documentation for this visit.  The documentation on 01/27/22 for the exam, diagnosis, procedures, and orders are all accurate and complete. ?

## 2022-03-31 ENCOUNTER — Other Ambulatory Visit: Payer: Self-pay | Admitting: Obstetrics and Gynecology

## 2022-03-31 DIAGNOSIS — Z809 Family history of malignant neoplasm, unspecified: Secondary | ICD-10-CM

## 2022-04-15 ENCOUNTER — Ambulatory Visit
Admission: RE | Admit: 2022-04-15 | Discharge: 2022-04-15 | Disposition: A | Discharge status: Home / Self Care | Payer: BC Managed Care – PPO | Source: Ambulatory Visit | Attending: Obstetrics and Gynecology | Admitting: Obstetrics and Gynecology

## 2022-04-15 DIAGNOSIS — Z809 Family history of malignant neoplasm, unspecified: Secondary | ICD-10-CM

## 2022-04-15 MED ORDER — GADOBUTROL 1 MMOL/ML IV SOLN
10.0000 mL | Freq: Once | INTRAVENOUS | Status: AC | PRN
  Administered 2022-04-15: 10 mL via INTRAVENOUS

## 2022-04-16 ENCOUNTER — Other Ambulatory Visit: Payer: Self-pay | Admitting: Obstetrics and Gynecology

## 2022-04-16 DIAGNOSIS — R928 Other abnormal and inconclusive findings on diagnostic imaging of breast: Secondary | ICD-10-CM

## 2022-04-22 ENCOUNTER — Ambulatory Visit: Payer: BC Managed Care – PPO

## 2022-04-22 ENCOUNTER — Ambulatory Visit
Admission: RE | Admit: 2022-04-22 | Discharge: 2022-04-22 | Disposition: A | Discharge status: Home / Self Care | Payer: BC Managed Care – PPO | Source: Ambulatory Visit | Attending: Obstetrics and Gynecology | Admitting: Obstetrics and Gynecology

## 2022-04-22 DIAGNOSIS — R928 Other abnormal and inconclusive findings on diagnostic imaging of breast: Secondary | ICD-10-CM

## 2022-05-06 ENCOUNTER — Ambulatory Visit
Admission: RE | Admit: 2022-05-06 | Discharge: 2022-05-06 | Disposition: A | Discharge status: Home / Self Care | Payer: BC Managed Care – PPO | Source: Ambulatory Visit | Attending: Obstetrics and Gynecology | Admitting: Obstetrics and Gynecology

## 2022-05-06 DIAGNOSIS — R928 Other abnormal and inconclusive findings on diagnostic imaging of breast: Secondary | ICD-10-CM

## 2022-05-06 DIAGNOSIS — N6312 Unspecified lump in the right breast, upper inner quadrant: Secondary | ICD-10-CM

## 2022-05-06 MED ORDER — GADOBUTROL 1 MMOL/ML IV SOLN
10.0000 mL | Freq: Once | INTRAVENOUS | Status: AC | PRN
Start: 2022-05-06 — End: 2022-05-06
  Administered 2022-05-06: 10 mL via INTRAVENOUS

## 2022-06-04 ENCOUNTER — Other Ambulatory Visit: Payer: Self-pay | Admitting: General Surgery

## 2022-06-04 DIAGNOSIS — R928 Other abnormal and inconclusive findings on diagnostic imaging of breast: Secondary | ICD-10-CM

## 2022-06-08 ENCOUNTER — Other Ambulatory Visit: Payer: Self-pay | Admitting: General Surgery

## 2022-06-08 DIAGNOSIS — R928 Other abnormal and inconclusive findings on diagnostic imaging of breast: Secondary | ICD-10-CM

## 2022-07-05 ENCOUNTER — Encounter (HOSPITAL_BASED_OUTPATIENT_CLINIC_OR_DEPARTMENT_OTHER): Payer: Self-pay | Admitting: General Surgery

## 2022-07-05 ENCOUNTER — Other Ambulatory Visit: Payer: Self-pay

## 2022-07-07 ENCOUNTER — Encounter (HOSPITAL_BASED_OUTPATIENT_CLINIC_OR_DEPARTMENT_OTHER)
Admission: RE | Admit: 2022-07-07 | Discharge: 2022-07-07 | Disposition: A | Discharge status: Home / Self Care | Payer: BC Managed Care – PPO | Source: Ambulatory Visit | Attending: General Surgery | Admitting: General Surgery

## 2022-07-07 ENCOUNTER — Other Ambulatory Visit: Payer: Self-pay

## 2022-07-07 DIAGNOSIS — Z01818 Encounter for other preprocedural examination: Secondary | ICD-10-CM | POA: Diagnosis present

## 2022-07-07 DIAGNOSIS — I10 Essential (primary) hypertension: Secondary | ICD-10-CM | POA: Diagnosis not present

## 2022-07-07 LAB — BASIC METABOLIC PANEL
Anion gap: 10 (ref 5–15)
BUN: 13 mg/dL (ref 6–20)
CO2: 26 mmol/L (ref 22–32)
Calcium: 10 mg/dL (ref 8.9–10.3)
Chloride: 102 mmol/L (ref 98–111)
Creatinine, Ser: 0.58 mg/dL (ref 0.44–1.00)
GFR, Estimated: >60 mL/min (ref >60)
Glucose, Bld: 88 mg/dL (ref 70–99)
Potassium: 4.1 mmol/L (ref 3.5–5.1)
Sodium: 138 mmol/L (ref 135–145)

## 2022-07-07 MED ORDER — CHLORHEXIDINE GLUCONATE CLOTH 2 % EX PADS: 6.0000 | MEDICATED_PAD | Freq: Once | CUTANEOUS | Status: DC

## 2022-07-07 NOTE — Progress Notes (Signed)
      Enhanced Recovery after Surgery  Enhanced Recovery after Surgery is a protocol used to improve the stress on your body and your recovery after surgery.  Patient Instructions  The night before surgery:  No food after midnight. ONLY clear liquids after midnight  The day of surgery (if you do NOT have diabetes):  Drink ONE (1) Pre-Surgery Clear Ensure as directed.   This drink was given to you during your hospital  pre-op appointment visit. The pre-op nurse will instruct you on the time to drink the  Pre-Surgery Ensure depending on your surgery time. Finish the drink at the designated time by the pre-op nurse.  Nothing else to drink after completing the  Pre-Surgery Clear Ensure.  The day of surgery (if you have diabetes): Drink ONE (1) Gatorade 2 (G2) as directed. This drink was given to you during your hospital  pre-op appointment visit.  The pre-op nurse will instruct you on the time to drink the   Gatorade 2 (G2) depending on your surgery time. Color of the Gatorade may vary. Red is not allowed. Nothing else to drink after completing the  Gatorade 2 (G2).         If you have questions, please contact your surgeon's office.  Surgical soap given with written instructions 

## 2022-07-12 ENCOUNTER — Ambulatory Visit
Admission: RE | Admit: 2022-07-12 | Discharge: 2022-07-12 | Disposition: A | Discharge status: Home / Self Care | Payer: BC Managed Care – PPO | Source: Ambulatory Visit | Attending: General Surgery | Admitting: General Surgery

## 2022-07-12 DIAGNOSIS — R928 Other abnormal and inconclusive findings on diagnostic imaging of breast: Secondary | ICD-10-CM

## 2022-07-12 NOTE — H&P (Addendum)
REFERRING PHYSICIAN:  Owens Shark     PROVIDER:  Georgianne Fick, MD   Care Team: Patient Care Team: Aletha Halim, PA as PCP - General (Family Medicine) Georgianne Fick, MD as Consulting Provider (Surgical Oncology) Lucillie Garfinkel (Gynecology)    MRN: [id number] DOB: 06-Jun-1971   Subjective    Chief Complaint: New Patient (Rt discordant bx)       History of Present Illness: Linda Marquez is a 51 y.o. female who is seen today as an office consultation at the request of Dr. Nolon Nations for evaluation of New Patient (Rt discordant bx)   Patient is referred for abnormal breast MRI.  The patient [facility] and reportedly has a defect with a greater than 20% predisposition for breast cancer.  She knows that it is not BRCA 1 or 2, but is not sure which one it is.  Her mother and maternal grandmother both had breast cancer in their 46s.  The patient underwent breast MRI screening and was found to have an indeterminate 1.1 cm enhancing mass in the upper inner quadrant on the right.  Core needle biopsy was performed which was discordant.  It showed breast benign breast tissue with sclerosing adenosis, passion, and dense stromal fibrosis with calcifications.  The patient is referred to discussed excisional biopsy.  The patient has not had any breast surgery before.  She denies breast pain.     Family cancer history: mother and maternal grandmother in 38s Work: administration   Diagnostic mammogram/[facility]- don';t have report     Breast MR 04/15/2022 IMPRESSION: Indeterminate 1.1 cm enhancing mass within the anterior superior right breast.   RECOMMENDATION: MRI guided core needle biopsy indeterminate right breast mass.   BI-RADS CATEGORY  4: Suspicious.   Pathology core needle biopsy: 05/06/2022 Breast, right, needle core biopsy, UIQ, barbell clip - BENIGN BREAST TISSUE WITH SCLEROSING ADENOSIS, PSEUDOANGIOMATOUS STROMAL  HYPERPLASIA (PASH) AND DENSE STROMAL FIBROSIS WITH ASSOCIATED CALCIFICATIONS.     Review of Systems: A complete review of systems was obtained from the patient.  I have reviewed this information and discussed as appropriate with the patient.  See HPI as well for other ROS.   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 No pertinent surgical history.      Allergies      Allergies  Allergen Reactions   Lisinopril Cough              Current Outpatient Medications on File Prior to Visit  Medication Sig Dispense Refill   atorvastatin (LIPITOR) 40 MG tablet Take 1 tablet by mouth once daily       hydroCHLOROthiazide (HYDRODIURIL) 25 MG tablet Take 1 tablet by mouth once daily       SYNTHROID 200 mcg tablet Take 1 tablet by mouth once daily       olmesartan-hydroCHLOROthiazide (BENICAR HCT) 20-12.5 mg tablet Take 1 tablet by mouth once daily        No current facility-administered medications on file prior to  visit.      Family History       Family History  Problem Relation Age of Onset   Obesity Mother     Hyperlipidemia (Elevated cholesterol) Mother     High blood pressure (Hypertension) Mother     Breast cancer Mother     Diabetes Mother     Hyperlipidemia (Elevated cholesterol) Father     Coronary Artery Disease (Blocked arteries around heart) Father          Social History       Tobacco Use  Smoking Status Never  Smokeless Tobacco Never      Social History  Social History        Socioeconomic History   Marital status: Life Partner  Tobacco Use   Smoking status: Never   Smokeless tobacco: Never  Substance and Sexual  Activity   Alcohol use: Yes   Drug use: Never        Objective:         Vitals:      BP: 128/84  Pulse: 104  Temp: 36.7 C (98 F)  SpO2: 98%  Weight: (!) 119 kg (262 lb 6.4 oz)  Height: 177.8 cm (5' 10")    Body mass index is 37.65 kg/m.   Gen:  No acute distress.  Well nourished and well groomed.   Neurological: Alert and oriented to person, place, and time. Coordination normal.  Head: Normocephalic and atraumatic.  Eyes: Conjunctivae are normal. Pupils are equal, round, and reactive to light. No scleral icterus.  Neck: Normal range of motion. Neck supple. No tracheal deviation or thyromegaly present.  Cardiovascular: Normal rate, regular rhythm, normal heart sounds and intact distal pulses.  Exam reveals no gallop and no friction rub.  No murmur heard. Breast: Right breast slightly smaller than left.  No palpable masses.  Heterogeneously dense tissue, more prominent bilateral UOQ.  No LAD. No nipple retraction or nipple discharge.   Respiratory: Effort normal.  No respiratory distress. No chest wall tenderness. Breath sounds normal.  No wheezes, rales or rhonchi.  GI: Soft. Bowel sounds are normal. The abdomen is soft and nontender.  There is no rebound and no guarding.  Musculoskeletal: Normal range of motion. Extremities are nontender.  Lymphadenopathy: No cervical, preauricular, postauricular or axillary adenopathy is present Skin: Skin is warm and dry. No rash noted. No diaphoresis. No erythema. No pallor. No clubbing, cyanosis, or edema.   Psychiatric: Normal mood and affect. Behavior is normal. Judgment and thought content normal.      Labs 05/17/2022 CMET with sl elevated ALT at 59     Assessment and Plan:        ICD-10-CM    1. Abnormal magnetic resonance imaging of right breast  R92.8       2. 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 Family history of breast cancer in female  Z72.3         We will get records from [facility] to  document which genetic predisposition to breast cancer that the patient has.   Patient has a right breast mass on breast MRI with discordant biopsy.  I will plan a seed localized right breast excisional biopsy.  I reviewed the surgery with the patient.  I discussed seed placement.  I reviewed the process of surgery, recovery, postop restrictions, and risks.  I discussed risk of bleeding, infection, seroma, breast lymphedema, chronic pain, dissatisfaction with scar, possible cancer diagnosis, possible  heart and lung complications, possible blood clot. The patient was informed that if we get back a cancer diagnosis, she may need additional surgeries and/or treatment that could include chemo, antihormone treatment and/or radiation.     The patient understands and wishes to proceed.   No follow-ups on file.   Moderately complex medical decision making.      Milus Height, MD FACS Surgical Oncology, General Surgery, Trauma and [facility] Surgery

## 2022-07-12 NOTE — Anesthesia Preprocedure Evaluation (Addendum)
Anesthesia Evaluation  Patient identified by MRN, date of birth, ID band Patient awake    Reviewed: Allergy & Precautions, H&P , NPO status , Patient's Chart, lab work & pertinent test results  Airway Mallampati: I  TM Distance: >3 FB Neck ROM: Full    Dental no notable dental hx. (+) Teeth Intact, Dental Advisory Given   Pulmonary neg pulmonary ROS,    Pulmonary exam normal breath sounds clear to auscultation       Cardiovascular Exercise Tolerance: Good hypertension, Pt. on medications negative cardio ROS Normal cardiovascular exam Rhythm:Regular Rate:Normal     Neuro/Psych negative neurological ROS  negative psych ROS   GI/Hepatic negative GI ROS, Neg liver ROS,   Endo/Other  negative endocrine ROSHypothyroidism   Renal/GU negative Renal ROS  negative genitourinary   Musculoskeletal negative musculoskeletal ROS (+)   Abdominal   Peds negative pediatric ROS (+)  Hematology negative hematology ROS (+)   Anesthesia Other Findings   Reproductive/Obstetrics negative OB ROS                            Anesthesia Physical Anesthesia Plan  ASA: 2  Anesthesia Plan: General   Post-op Pain Management: Minimal or no pain anticipated   Induction: Intravenous  PONV Risk Score and Plan: 3 and Ondansetron, Dexamethasone and Treatment may vary due to age or medical condition  Airway Management Planned: LMA  Additional Equipment: None  Intra-op Plan:   Post-operative Plan: Extubation in OR  Informed Consent: I have reviewed the patients History and Physical, chart, labs and discussed the procedure including the risks, benefits and alternatives for the proposed anesthesia with the patient or authorized representative who has indicated his/her understanding and acceptance.       Plan Discussed with: Anesthesiologist  Anesthesia Plan Comments: ( )        Anesthesia Quick  Evaluation

## 2022-07-13 ENCOUNTER — Ambulatory Visit
Admission: RE | Admit: 2022-07-13 | Discharge: 2022-07-13 | Disposition: A | Discharge status: Home / Self Care | Payer: BC Managed Care – PPO | Source: Ambulatory Visit | Attending: General Surgery | Admitting: General Surgery

## 2022-07-13 ENCOUNTER — Other Ambulatory Visit: Payer: Self-pay

## 2022-07-13 ENCOUNTER — Ambulatory Visit (HOSPITAL_BASED_OUTPATIENT_CLINIC_OR_DEPARTMENT_OTHER): Payer: BC Managed Care – PPO | Admitting: Anesthesiology

## 2022-07-13 ENCOUNTER — Encounter (HOSPITAL_BASED_OUTPATIENT_CLINIC_OR_DEPARTMENT_OTHER)
Admission: RE | Disposition: A | Discharge status: Home / Self Care | Payer: Self-pay | Source: Home / Self Care | Attending: General Surgery

## 2022-07-13 ENCOUNTER — Encounter (HOSPITAL_BASED_OUTPATIENT_CLINIC_OR_DEPARTMENT_OTHER): Payer: Self-pay | Admitting: General Surgery

## 2022-07-13 ENCOUNTER — Ambulatory Visit (HOSPITAL_BASED_OUTPATIENT_CLINIC_OR_DEPARTMENT_OTHER)
Admission: RE | Admit: 2022-07-13 | Discharge: 2022-07-13 | Disposition: A | Discharge status: Home / Self Care | Payer: BC Managed Care – PPO | Attending: General Surgery | Admitting: General Surgery

## 2022-07-13 DIAGNOSIS — R9230 Dense breasts, unspecified: Secondary | ICD-10-CM | POA: Diagnosis not present

## 2022-07-13 DIAGNOSIS — N6489 Other specified disorders of breast: Secondary | ICD-10-CM | POA: Insufficient documentation

## 2022-07-13 DIAGNOSIS — N6011 Diffuse cystic mastopathy of right breast: Secondary | ICD-10-CM | POA: Diagnosis not present

## 2022-07-13 DIAGNOSIS — I10 Essential (primary) hypertension: Secondary | ICD-10-CM | POA: Insufficient documentation

## 2022-07-13 DIAGNOSIS — Z1501 Genetic susceptibility to malignant neoplasm of breast: Secondary | ICD-10-CM | POA: Insufficient documentation

## 2022-07-13 DIAGNOSIS — Z803 Family history of malignant neoplasm of breast: Secondary | ICD-10-CM | POA: Diagnosis not present

## 2022-07-13 DIAGNOSIS — R928 Other abnormal and inconclusive findings on diagnostic imaging of breast: Secondary | ICD-10-CM

## 2022-07-13 DIAGNOSIS — N6021 Fibroadenosis of right breast: Secondary | ICD-10-CM | POA: Insufficient documentation

## 2022-07-13 DIAGNOSIS — Z01818 Encounter for other preprocedural examination: Secondary | ICD-10-CM

## 2022-07-13 HISTORY — PX: RADIOACTIVE SEED GUIDED EXCISIONAL BREAST BIOPSY: SHX6490

## 2022-07-13 LAB — POCT PREGNANCY, URINE: Preg Test, Ur: NEGATIVE

## 2022-07-13 SURGERY — RADIOACTIVE SEED GUIDED BREAST BIOPSY
Anesthesia: General | Site: Breast | Laterality: Right

## 2022-07-13 MED ORDER — CEFAZOLIN SODIUM-DEXTROSE 2-4 GM/100ML-% IV SOLN
2.0000 g | INTRAVENOUS | Status: AC
  Administered 2022-07-13: 2 g via INTRAVENOUS

## 2022-07-13 MED ORDER — ONDANSETRON HCL 4 MG/2ML IJ SOLN
INTRAMUSCULAR | Status: DC | PRN
  Administered 2022-07-13: 4 mg via INTRAVENOUS

## 2022-07-13 MED ORDER — FENTANYL CITRATE (PF) 100 MCG/2ML IJ SOLN: 25.0000 ug | INTRAMUSCULAR | Status: DC | PRN

## 2022-07-13 MED ORDER — FENTANYL CITRATE (PF) 100 MCG/2ML IJ SOLN
INTRAMUSCULAR | Status: AC
  Filled 2022-07-13: qty 2

## 2022-07-13 MED ORDER — PHENYLEPHRINE HCL (PRESSORS) 10 MG/ML IV SOLN
INTRAVENOUS | Status: DC | PRN
  Administered 2022-07-13 (×3): 80 ug via INTRAVENOUS

## 2022-07-13 MED ORDER — LACTATED RINGERS IV SOLN: INTRAVENOUS | Status: DC

## 2022-07-13 MED ORDER — CEFAZOLIN SODIUM-DEXTROSE 2-4 GM/100ML-% IV SOLN
INTRAVENOUS | Status: AC
  Filled 2022-07-13: qty 100

## 2022-07-13 MED ORDER — PROPOFOL 500 MG/50ML IV EMUL
INTRAVENOUS | Status: DC | PRN
  Administered 2022-07-13: 50 ug/kg/min via INTRAVENOUS

## 2022-07-13 MED ORDER — MEPERIDINE HCL 25 MG/ML IJ SOLN: 6.2500 mg | INTRAMUSCULAR | Status: DC | PRN

## 2022-07-13 MED ORDER — ACETAMINOPHEN 325 MG PO TABS: 325.0000 mg | ORAL_TABLET | ORAL | Status: DC | PRN

## 2022-07-13 MED ORDER — ACETAMINOPHEN 160 MG/5ML PO SOLN: 325.0000 mg | ORAL | Status: DC | PRN

## 2022-07-13 MED ORDER — PROPOFOL 10 MG/ML IV BOLUS
INTRAVENOUS | Status: DC | PRN
  Administered 2022-07-13: 160 mg via INTRAVENOUS

## 2022-07-13 MED ORDER — MIDAZOLAM HCL 2 MG/2ML IJ SOLN
INTRAMUSCULAR | Status: AC
  Filled 2022-07-13: qty 2

## 2022-07-13 MED ORDER — MIDAZOLAM HCL 5 MG/5ML IJ SOLN
INTRAMUSCULAR | Status: DC | PRN
  Administered 2022-07-13: 2 mg via INTRAVENOUS

## 2022-07-13 MED ORDER — BUPIVACAINE-EPINEPHRINE 0.5% -1:200000 IJ SOLN
INTRAMUSCULAR | Status: DC | PRN
  Administered 2022-07-13: 30 mL

## 2022-07-13 MED ORDER — LIDOCAINE HCL (PF) 1 % IJ SOLN
INTRAMUSCULAR | Status: DC | PRN
  Administered 2022-07-13: 30 mL

## 2022-07-13 MED ORDER — EPHEDRINE SULFATE (PRESSORS) 50 MG/ML IJ SOLN
INTRAMUSCULAR | Status: DC | PRN
  Administered 2022-07-13 (×2): 10 mg via INTRAVENOUS

## 2022-07-13 MED ORDER — DEXAMETHASONE SODIUM PHOSPHATE 4 MG/ML IJ SOLN
INTRAMUSCULAR | Status: DC | PRN
  Administered 2022-07-13: 5 mg via INTRAVENOUS

## 2022-07-13 MED ORDER — LIDOCAINE HCL (PF) 1 % IJ SOLN
INTRAMUSCULAR | Status: AC
  Filled 2022-07-13: qty 30

## 2022-07-13 MED ORDER — OXYCODONE HCL 5 MG PO TABS: 5.0000 mg | ORAL_TABLET | Freq: Four times a day (QID) | ORAL | 0 refills | Status: DC | PRN

## 2022-07-13 MED ORDER — BUPIVACAINE HCL (PF) 0.25 % IJ SOLN
INTRAMUSCULAR | Status: AC
  Filled 2022-07-13: qty 90

## 2022-07-13 MED ORDER — DEXAMETHASONE SODIUM PHOSPHATE 10 MG/ML IJ SOLN
INTRAMUSCULAR | Status: AC
  Filled 2022-07-13: qty 1

## 2022-07-13 MED ORDER — BUPIVACAINE-EPINEPHRINE (PF) 0.5% -1:200000 IJ SOLN
INTRAMUSCULAR | Status: AC
  Filled 2022-07-13: qty 30

## 2022-07-13 MED ORDER — ACETAMINOPHEN 500 MG PO TABS
1000.0000 mg | ORAL_TABLET | ORAL | Status: AC
  Administered 2022-07-13: 1000 mg via ORAL

## 2022-07-13 MED ORDER — FENTANYL CITRATE (PF) 100 MCG/2ML IJ SOLN
INTRAMUSCULAR | Status: DC | PRN
  Administered 2022-07-13 (×2): 50 ug via INTRAVENOUS

## 2022-07-13 MED ORDER — ONDANSETRON HCL 4 MG/2ML IJ SOLN
INTRAMUSCULAR | Status: AC
  Filled 2022-07-13: qty 2

## 2022-07-13 MED ORDER — OXYCODONE HCL 5 MG PO TABS: 5.0000 mg | ORAL_TABLET | Freq: Once | ORAL | Status: DC | PRN

## 2022-07-13 MED ORDER — LIDOCAINE HCL (CARDIAC) PF 100 MG/5ML IV SOSY
PREFILLED_SYRINGE | INTRAVENOUS | Status: DC | PRN
  Administered 2022-07-13: 100 mg via INTRAVENOUS

## 2022-07-13 MED ORDER — OXYCODONE HCL 5 MG/5ML PO SOLN: 5.0000 mg | Freq: Once | ORAL | Status: DC | PRN

## 2022-07-13 MED ORDER — ACETAMINOPHEN 500 MG PO TABS
ORAL_TABLET | ORAL | Status: AC
  Filled 2022-07-13: qty 2

## 2022-07-13 MED ORDER — ONDANSETRON HCL 4 MG/2ML IJ SOLN: 4.0000 mg | Freq: Once | INTRAMUSCULAR | Status: DC | PRN

## 2022-07-13 SURGICAL SUPPLY — 49 items
ADH SKN CLS APL DERMABOND .7 (GAUZE/BANDAGES/DRESSINGS) ×1
APL PRP STRL LF DISP 70% ISPRP (MISCELLANEOUS) ×1
BINDER BREAST XXLRG (GAUZE/BANDAGES/DRESSINGS) IMPLANT
BLADE SURG 10 STRL SS (BLADE) ×1 IMPLANT
BLADE SURG 15 STRL LF DISP TIS (BLADE) IMPLANT
BLADE SURG 15 STRL SS (BLADE)
CANISTER SUC SOCK COL 7IN (MISCELLANEOUS) IMPLANT
CANISTER SUCT 1200ML W/VALVE (MISCELLANEOUS) ×1 IMPLANT
CHLORAPREP W/TINT 26 (MISCELLANEOUS) ×1 IMPLANT
CLIP TI LARGE 6 (CLIP) ×1 IMPLANT
COVER BACK TABLE 60X90IN (DRAPES) ×1 IMPLANT
COVER MAYO STAND STRL (DRAPES) ×1 IMPLANT
COVER PROBE W GEL 5X96 (DRAPES) ×1 IMPLANT
DERMABOND ADVANCED .7 DNX12 (GAUZE/BANDAGES/DRESSINGS) ×1 IMPLANT
DRAPE LAPAROSCOPIC ABDOMINAL (DRAPES) ×1 IMPLANT
DRAPE UTILITY XL STRL (DRAPES) ×1 IMPLANT
ELECT COATED BLADE 2.86 ST (ELECTRODE) ×1 IMPLANT
ELECT REM PT RETURN 9FT ADLT (ELECTROSURGICAL) ×1
ELECTRODE REM PT RTRN 9FT ADLT (ELECTROSURGICAL) ×1 IMPLANT
GAUZE SPONGE 4X4 12PLY STRL LF (GAUZE/BANDAGES/DRESSINGS) ×1 IMPLANT
GLOVE BIO SURGEON STRL SZ 6 (GLOVE) ×1 IMPLANT
GLOVE BIOGEL PI IND STRL 6.5 (GLOVE) ×1 IMPLANT
GOWN STRL REUS W/ TWL LRG LVL3 (GOWN DISPOSABLE) ×1 IMPLANT
GOWN STRL REUS W/TWL 2XL LVL3 (GOWN DISPOSABLE) ×1 IMPLANT
GOWN STRL REUS W/TWL LRG LVL3 (GOWN DISPOSABLE) ×1
KIT MARKER MARGIN INK (KITS) ×1 IMPLANT
LIGHT WAVEGUIDE WIDE FLAT (MISCELLANEOUS) IMPLANT
NDL HYPO 25X1 1.5 SAFETY (NEEDLE) ×1 IMPLANT
NEEDLE HYPO 25X1 1.5 SAFETY (NEEDLE) ×1 IMPLANT
NS IRRIG 1000ML POUR BTL (IV SOLUTION) ×1 IMPLANT
PACK BASIN DAY SURGERY FS (CUSTOM PROCEDURE TRAY) ×1 IMPLANT
PENCIL SMOKE EVACUATOR (MISCELLANEOUS) ×1 IMPLANT
SLEEVE SCD COMPRESS KNEE MED (STOCKING) ×1 IMPLANT
SPIKE FLUID TRANSFER (MISCELLANEOUS) IMPLANT
SPONGE T-LAP 18X18 ~~LOC~~+RFID (SPONGE) ×1 IMPLANT
STAPLER VISISTAT 35W (STAPLE) IMPLANT
STRIP CLOSURE SKIN 1/2X4 (GAUZE/BANDAGES/DRESSINGS) ×1 IMPLANT
SUT MON AB 4-0 PC3 18 (SUTURE) ×1 IMPLANT
SUT SILK 2 0 SH (SUTURE) IMPLANT
SUT VIC AB 2-0 SH 18 (SUTURE) IMPLANT
SUT VIC AB 3-0 SH 27 (SUTURE) ×1
SUT VIC AB 3-0 SH 27X BRD (SUTURE) ×1 IMPLANT
SUT VICRYL 3-0 CR8 SH (SUTURE) IMPLANT
SYR BULB EAR ULCER 3OZ GRN STR (SYRINGE) ×1 IMPLANT
SYR CONTROL 10ML LL (SYRINGE) ×1 IMPLANT
TOWEL GREEN STERILE FF (TOWEL DISPOSABLE) ×1 IMPLANT
TRAY FAXITRON CT DISP (TRAY / TRAY PROCEDURE) ×1 IMPLANT
TUBE CONNECTING 20X1/4 (TUBING) ×1 IMPLANT
YANKAUER SUCT BULB TIP NO VENT (SUCTIONS) ×1 IMPLANT

## 2022-07-13 NOTE — Interval H&P Note (Signed)
History and Physical Interval Note:  07/13/2022 7:34 AM  Linda Marquez  has presented today for surgery, with the diagnosis of ABNORMAL RIGHT BREAST MRI.  The various methods of treatment have been discussed with the patient and family. After consideration of risks, benefits and other options for treatment, the patient has consented to  Procedure(s): RADIOACTIVE SEED GUIDED EXCISIONAL RIGHT BREAST BIOPSY (Right) as a surgical intervention.  The patient's history has been reviewed, patient examined, no change in status, stable for surgery.  I have reviewed the patient's chart and labs.  Questions were answered to the patient's satisfaction.     Stark Klein

## 2022-07-13 NOTE — Op Note (Signed)
Right Breast Radioactive seed localized excisional biopsy  Indications: This patient presents with history of abnormal right mammogram with discordant core needle biopsy (sclerosing adenosis)    Pre-operative Diagnosis: abnormal right mammogram    Post-operative Diagnosis: abnormal right mammogram  Surgeon: Stark Klein   Anesthesia: General endotracheal anesthesia  ASA Class: 2  Procedure Details  The patient was seen in the Holding Room. The risks, benefits, complications, treatment options, and expected outcomes were discussed with the patient. The possibilities of bleeding, infection, the need for additional procedures, failure to diagnose a condition, and creating a complication requiring transfusion or operation were discussed with the patient. The patient concurred with the proposed plan, giving informed consent.  The site of surgery properly noted/marked. The patient was taken to Operating Room # 2, identified, and the procedure verified as right Breast seed localized excisional biopsy. A Time Out was held and the above information confirmed.  The right breast and chest were prepped and draped in standard fashion. A superior circumareolar incision was made near the previously placed radioactive seed.  Dissection was carried down around the point of maximum signal intensity. The cautery was used to perform the dissection.   The specimen was inked with the margin marker paint kit.    Specimen radiography confirmed inclusion of the mammographic lesion, the clip, and the seed.  The 3D mammogram showed the clip very close anteriorly and medially so additional margins were taken in those locations.  The background signal in the breast was zero.  One clip was placed in the breast cavity.  Hemostasis was achieved with cautery.  The wound was irrigated and closed with 3-0 vicryl interrupted deep dermal sutures and 4-0 monocryl running subcuticular suture.  60 mL local anesthetic was infiltrated into  the skin and surrounding tissue.      Sterile dressings were applied. At the end of the operation, all sponge, instrument, and needle counts were correct.  Findings: Seed, clip in specimen.  Anterior margin is skin.  Estimated Blood Loss:  min         Specimens: right breast tissue with seed, additional medial margin, additional anterior margin.           Complications:  None; patient tolerated the procedure well.         Disposition: PACU - hemodynamically stable.         Condition: stable

## 2022-07-13 NOTE — Anesthesia Procedure Notes (Signed)
Procedure Name: LMA Insertion Date/Time: 07/13/2022 7:47 AM  Performed by: Maryella Shivers, CRNAPre-anesthesia Checklist: Patient identified, Emergency Drugs available, Suction available and Patient being monitored Patient Re-evaluated:Patient Re-evaluated prior to induction Oxygen Delivery Method: Circle system utilized Preoxygenation: Pre-oxygenation with 100% oxygen Induction Type: IV induction Ventilation: Mask ventilation without difficulty LMA: LMA inserted LMA Size: 4.0 Number of attempts: 1 Airway Equipment and Method: Bite block Placement Confirmation: positive ETCO2 Tube secured with: Tape Dental Injury: Teeth and Oropharynx as per pre-operative assessment

## 2022-07-13 NOTE — Transfer of Care (Signed)
Immediate Anesthesia Transfer of Care Note  Patient: Linda Marquez  Procedure(s) Performed: RADIOACTIVE SEED GUIDED EXCISIONAL RIGHT BREAST BIOPSY (Right: Breast)  Patient Location: PACU  Anesthesia Type:General  Level of Consciousness: sedated  Airway & Oxygen Therapy: Patient Spontanous Breathing and Patient connected to face mask oxygen  Post-op Assessment: Report given to RN and Post -op Vital signs reviewed and stable  Post vital signs: Reviewed and stable  Last Vitals:  Vitals Value Taken Time  BP 124/80 07/13/22 0911  Temp 36.9 C 07/13/22 0912  Pulse 91 07/13/22 0913  Resp 15 07/13/22 0913  SpO2 95 % 07/13/22 0913  Vitals shown include unvalidated device data.  Last Pain:  Vitals:   07/13/22 7829  TempSrc: Oral  PainSc: 0-No pain         Complications: No notable events documented.

## 2022-07-13 NOTE — Anesthesia Postprocedure Evaluation (Signed)
Anesthesia Post Note  Patient: Linda Marquez  Procedure(s) Performed: RADIOACTIVE SEED GUIDED EXCISIONAL RIGHT BREAST BIOPSY (Right: Breast)     Patient location during evaluation: PACU Anesthesia Type: General Level of consciousness: awake and alert Pain management: pain level controlled Vital Signs Assessment: post-procedure vital signs reviewed and stable Respiratory status: spontaneous breathing, nonlabored ventilation, respiratory function stable and patient connected to nasal cannula oxygen Cardiovascular status: blood pressure returned to baseline and stable Postop Assessment: no apparent nausea or vomiting Anesthetic complications: no   No notable events documented.  Last Vitals:  Vitals:   07/13/22 0931 07/13/22 1007  BP:  133/67  Pulse:  87  Resp:  20  Temp:  36.6 C  SpO2: 94% 97%    Last Pain:  Vitals:   07/13/22 1007  TempSrc: Oral  PainSc: 0-No pain                 Dalen Hennessee

## 2022-07-13 NOTE — Discharge Instructions (Addendum)
[place] Office Phone Number [phone]  BREAST BIOPSY/ PARTIAL MASTECTOMY: POST OP INSTRUCTIONS  Always review your discharge instruction sheet given to you by the facility where your surgery was performed.  IF YOU HAVE DISABILITY OR FAMILY LEAVE FORMS, YOU MUST BRING THEM TO THE OFFICE FOR PROCESSING.  DO NOT GIVE THEM TO YOUR DOCTOR.  Take 2 tylenol (acetominophen) three times a day for 3 days (starting at 1pm)  If you still have pain, add ibuprofen with food in between if able to take this (if you have kidney issues or stomach issues, do not take ibuprofen).  If both of those are not enough, add the narcotic pain pill.  If you find you are needing a lot of this overnight after surgery, call the next morning for a refill.    Prescriptions will not be filled after 5pm or on week-ends. Take your usually prescribed medications unless otherwise directed You should eat very light the first 24 hours after surgery, such as soup, crackers, pudding, etc.  Resume your normal diet the day after surgery. Most patients will experience some swelling and bruising in the breast.  Ice packs and a good support bra will help.  Swelling and bruising can take several days to resolve.  It is common to experience some constipation if taking pain medication after surgery.  Increasing fluid intake and taking a stool softener will usually help or prevent this problem from occurring.  A mild laxative (Milk of Magnesia or Miralax) should be taken according to package directions if there are no bowel movements after 48 hours. Unless discharge instructions indicate otherwise, you may remove your bandages 48 hours after surgery, and you may shower at that time.  You may have steri-strips (small skin tapes) in place directly over the incision.  These strips should be left on the skin at least for for 7-10 days.    ACTIVITIES:  You may resume regular daily activities (gradually increasing) beginning the next  day.  Wearing a good support bra or sports bra (or the breast binder) minimizes pain and swelling.  You may have sexual intercourse when it is comfortable. No heavy lifting for 1-2 weeks (not over around 10 pounds).  You may drive when you no longer are taking prescription pain medication, you can comfortably wear a seatbelt, and you can safely maneuver your car and apply brakes. RETURN TO WORK:  __________3-14 days depending on job. _______________ Dennis Bast should see your doctor in the office for a follow-up appointment approximately two weeks after your surgery.  Your doctor's nurse will typically make your follow-up appointment when she calls you with your pathology report.  Expect your pathology report 3-4 business days after your surgery.  You may call to check if you do not hear from [place] after three days.   WHEN TO CALL YOUR DOCTOR: Fever over 101.0 Nausea and/or vomiting. Extreme swelling or bruising. Continued bleeding from incision. Increased pain, redness, or drainage from the incision.  The clinic staff is available to answer your questions during regular business hours.  Please don't hesitate to call and ask to speak to one of the nurses for clinical concerns.  If you have a medical emergency, go to the nearest emergency room or call 911.  A surgeon from [facility] is always on call at the hospital.  For further questions, please visit [email or web address]   Post Anesthesia Home Care Instructions  Activity: Get plenty of rest for the remainder of the day. A responsible individual  must stay with you for 24 hours following the procedure.  For the next 24 hours, DO NOT: -Drive a car -Paediatric nurse -Drink alcoholic beverages -Take any medication unless instructed by your physician -Make any legal decisions or sign important papers.  Meals: Start with liquid foods such as gelatin or soup. Progress to regular foods as tolerated. Avoid greasy, spicy, heavy foods.  If nausea and/or vomiting occur, drink only clear liquids until the nausea and/or vomiting subsides. Call your physician if vomiting continues.  Special Instructions/Symptoms: Your throat may feel dry or sore from the anesthesia or the breathing tube placed in your throat during surgery. If this causes discomfort, gargle with warm salt water. The discomfort should disappear within 24 hours.  If you had a scopolamine patch placed behind your ear for the management of post- operative nausea and/or vomiting:  1. The medication in the patch is effective for 72 hours, after which it should be removed.  Wrap patch in a tissue and discard in the trash. Wash hands thoroughly with soap and water. 2. You may remove the patch earlier than 72 hours if you experience unpleasant side effects which may include dry mouth, dizziness or visual disturbances. 3. Avoid touching the patch. Wash your hands with soap and water after contact with the patch.

## 2022-07-15 LAB — SURGICAL PATHOLOGY

## 2022-09-01 ENCOUNTER — Encounter (INDEPENDENT_AMBULATORY_CARE_PROVIDER_SITE_OTHER): Payer: BC Managed Care – PPO | Admitting: Ophthalmology

## 2022-10-21 ENCOUNTER — Encounter (HOSPITAL_COMMUNITY): Payer: Self-pay

## 2022-12-09 ENCOUNTER — Encounter (INDEPENDENT_AMBULATORY_CARE_PROVIDER_SITE_OTHER): Payer: Self-pay

## 2022-12-09 ENCOUNTER — Encounter (INDEPENDENT_AMBULATORY_CARE_PROVIDER_SITE_OTHER): Payer: BC Managed Care – PPO | Admitting: Ophthalmology

## 2023-01-11 ENCOUNTER — Ambulatory Visit: Payer: BC Managed Care – PPO | Admitting: Dermatology

## 2023-01-18 ENCOUNTER — Other Ambulatory Visit: Payer: Self-pay | Admitting: General Surgery

## 2023-01-18 DIAGNOSIS — Z1501 Genetic susceptibility to malignant neoplasm of breast: Secondary | ICD-10-CM

## 2023-01-18 DIAGNOSIS — Z803 Family history of malignant neoplasm of breast: Secondary | ICD-10-CM

## 2023-01-18 DIAGNOSIS — R928 Other abnormal and inconclusive findings on diagnostic imaging of breast: Secondary | ICD-10-CM

## 2023-02-24 ENCOUNTER — Ambulatory Visit
Admission: RE | Admit: 2023-02-24 | Discharge: 2023-02-24 | Disposition: A | Discharge status: Home / Self Care | Payer: BC Managed Care – PPO | Source: Ambulatory Visit | Attending: General Surgery | Admitting: General Surgery

## 2023-02-24 DIAGNOSIS — Z803 Family history of malignant neoplasm of breast: Secondary | ICD-10-CM

## 2023-02-24 DIAGNOSIS — R928 Other abnormal and inconclusive findings on diagnostic imaging of breast: Secondary | ICD-10-CM

## 2023-02-24 DIAGNOSIS — Z1501 Genetic susceptibility to malignant neoplasm of breast: Secondary | ICD-10-CM

## 2023-03-02 ENCOUNTER — Other Ambulatory Visit: Payer: Self-pay | Admitting: General Surgery

## 2023-03-02 DIAGNOSIS — R928 Other abnormal and inconclusive findings on diagnostic imaging of breast: Secondary | ICD-10-CM

## 2023-03-10 ENCOUNTER — Other Ambulatory Visit: Payer: Self-pay | Admitting: General Surgery

## 2023-03-10 ENCOUNTER — Ambulatory Visit
Admission: RE | Admit: 2023-03-10 | Discharge: 2023-03-10 | Disposition: A | Discharge status: Home / Self Care | Payer: BC Managed Care – PPO | Source: Ambulatory Visit | Attending: General Surgery | Admitting: General Surgery

## 2023-03-10 DIAGNOSIS — R928 Other abnormal and inconclusive findings on diagnostic imaging of breast: Secondary | ICD-10-CM

## 2023-03-10 DIAGNOSIS — R921 Mammographic calcification found on diagnostic imaging of breast: Secondary | ICD-10-CM

## 2023-03-11 ENCOUNTER — Ambulatory Visit
Admission: RE | Admit: 2023-03-11 | Discharge: 2023-03-11 | Disposition: A | Discharge status: Home / Self Care | Payer: BC Managed Care – PPO | Source: Ambulatory Visit | Attending: General Surgery | Admitting: General Surgery

## 2023-03-11 DIAGNOSIS — R921 Mammographic calcification found on diagnostic imaging of breast: Secondary | ICD-10-CM

## 2023-03-11 HISTORY — PX: BREAST BIOPSY: SHX20

## 2023-03-14 ENCOUNTER — Other Ambulatory Visit: Payer: Self-pay | Admitting: General Surgery

## 2023-03-14 DIAGNOSIS — R921 Mammographic calcification found on diagnostic imaging of breast: Secondary | ICD-10-CM

## 2023-07-20 ENCOUNTER — Other Ambulatory Visit: Payer: Self-pay | Admitting: General Surgery

## 2023-07-20 DIAGNOSIS — Z1501 Genetic susceptibility to malignant neoplasm of breast: Secondary | ICD-10-CM

## 2023-07-20 DIAGNOSIS — Z803 Family history of malignant neoplasm of breast: Secondary | ICD-10-CM

## 2023-07-20 DIAGNOSIS — R928 Other abnormal and inconclusive findings on diagnostic imaging of breast: Secondary | ICD-10-CM

## 2023-09-03 ENCOUNTER — Ambulatory Visit
Admission: RE | Admit: 2023-09-03 | Discharge: 2023-09-03 | Disposition: A | Discharge status: Home / Self Care | Payer: BC Managed Care – PPO | Source: Ambulatory Visit | Attending: General Surgery | Admitting: General Surgery

## 2023-09-03 DIAGNOSIS — Z803 Family history of malignant neoplasm of breast: Secondary | ICD-10-CM

## 2023-09-03 DIAGNOSIS — R928 Other abnormal and inconclusive findings on diagnostic imaging of breast: Secondary | ICD-10-CM

## 2023-09-03 DIAGNOSIS — Z1501 Genetic susceptibility to malignant neoplasm of breast: Secondary | ICD-10-CM

## 2023-09-03 MED ORDER — GADOPICLENOL 0.5 MMOL/ML IV SOLN
10.0000 mL | Freq: Once | INTRAVENOUS | Status: AC | PRN
  Administered 2023-09-03: 10 mL via INTRAVENOUS

## 2023-09-12 ENCOUNTER — Ambulatory Visit
Admission: RE | Admit: 2023-09-12 | Discharge: 2023-09-12 | Disposition: A | Discharge status: Home / Self Care | Payer: BC Managed Care – PPO | Source: Ambulatory Visit | Attending: General Surgery | Admitting: General Surgery

## 2023-09-12 ENCOUNTER — Other Ambulatory Visit: Payer: Self-pay | Admitting: General Surgery

## 2023-09-12 ENCOUNTER — Ambulatory Visit: Payer: BC Managed Care – PPO

## 2023-09-12 DIAGNOSIS — R921 Mammographic calcification found on diagnostic imaging of breast: Secondary | ICD-10-CM

## 2024-02-03 LAB — COLOGUARD: COLOGUARD: NEGATIVE

## 2024-03-01 ENCOUNTER — Ambulatory Visit
Admission: RE | Admit: 2024-03-01 | Discharge: 2024-03-01 | Disposition: A | Discharge status: Home / Self Care | Source: Ambulatory Visit | Attending: General Surgery | Admitting: General Surgery

## 2024-03-01 DIAGNOSIS — R921 Mammographic calcification found on diagnostic imaging of breast: Secondary | ICD-10-CM

## 2024-11-02 ENCOUNTER — Other Ambulatory Visit: Payer: Self-pay

## 2024-11-02 ENCOUNTER — Encounter (HOSPITAL_BASED_OUTPATIENT_CLINIC_OR_DEPARTMENT_OTHER): Payer: Self-pay | Admitting: Ophthalmology

## 2024-11-07 ENCOUNTER — Other Ambulatory Visit (HOSPITAL_COMMUNITY): Admission: RE | Admit: 2024-11-07 | Source: Ambulatory Visit

## 2024-11-07 ENCOUNTER — Encounter (HOSPITAL_BASED_OUTPATIENT_CLINIC_OR_DEPARTMENT_OTHER)
Admission: RE | Admit: 2024-11-07 | Discharge: 2024-11-07 | Disposition: A | Source: Ambulatory Visit | Attending: Ophthalmology

## 2024-11-07 DIAGNOSIS — Z01818 Encounter for other preprocedural examination: Secondary | ICD-10-CM | POA: Insufficient documentation

## 2024-11-07 DIAGNOSIS — I10 Essential (primary) hypertension: Secondary | ICD-10-CM | POA: Insufficient documentation

## 2024-11-07 LAB — BASIC METABOLIC PANEL WITH GFR
Anion gap: 12 (ref 5–15)
BUN: 16 mg/dL (ref 6–20)
CO2: 26 mmol/L (ref 22–32)
Calcium: 11.2 mg/dL — ABNORMAL HIGH (ref 8.9–10.3)
Chloride: 102 mmol/L (ref 98–111)
Creatinine, Ser: 0.59 mg/dL (ref 0.44–1.00)
GFR, Estimated: >60 mL/min
Glucose, Bld: 98 mg/dL (ref 70–99)
Potassium: 4.6 mmol/L (ref 3.5–5.1)
Sodium: 140 mmol/L (ref 135–145)

## 2024-11-09 ENCOUNTER — Encounter (HOSPITAL_BASED_OUTPATIENT_CLINIC_OR_DEPARTMENT_OTHER): Payer: Self-pay | Admitting: Certified Registered Nurse Anesthetist

## 2024-11-09 ENCOUNTER — Ambulatory Visit: Payer: Self-pay | Admitting: Ophthalmology

## 2024-11-09 ENCOUNTER — Encounter (HOSPITAL_BASED_OUTPATIENT_CLINIC_OR_DEPARTMENT_OTHER): Admission: RE | Disposition: A | Payer: Self-pay | Source: Home / Self Care | Attending: Ophthalmology

## 2024-11-09 ENCOUNTER — Ambulatory Visit (HOSPITAL_BASED_OUTPATIENT_CLINIC_OR_DEPARTMENT_OTHER)
Admission: RE | Admit: 2024-11-09 | Discharge: 2024-11-09 | Disposition: A | Source: Home / Self Care | Attending: Ophthalmology | Admitting: Ophthalmology

## 2024-11-09 ENCOUNTER — Encounter (HOSPITAL_BASED_OUTPATIENT_CLINIC_OR_DEPARTMENT_OTHER): Payer: Self-pay | Admitting: Ophthalmology

## 2024-11-09 DIAGNOSIS — I10 Essential (primary) hypertension: Secondary | ICD-10-CM

## 2024-11-09 MED ORDER — NEOMYCIN-POLYMYXIN-DEXAMETH 0.1 % OP OINT
TOPICAL_OINTMENT | OPHTHALMIC | Status: DC | PRN
  Administered 2024-11-09: 1 via OPHTHALMIC

## 2024-11-09 MED ORDER — BUPIVACAINE HCL 0.5 % IJ SOLN
INTRAMUSCULAR | Status: DC | PRN
  Administered 2024-11-09: 1.5 mL

## 2024-11-09 MED ORDER — ACETAMINOPHEN 500 MG PO TABS: 1000.0000 mg | ORAL_TABLET | Freq: Once | ORAL | Status: DC

## 2024-11-09 MED ORDER — ATROPINE SULFATE 0.4 MG/ML IV SOLN
INTRAVENOUS | Status: DC | PRN
  Administered 2024-11-09: .1 mg via INTRAVENOUS

## 2024-11-09 MED ORDER — PROPOFOL 10 MG/ML IV BOLUS
INTRAVENOUS | Status: AC
  Filled 2024-11-09: qty 20

## 2024-11-09 MED ORDER — DEXAMETHASONE SOD PHOSPHATE PF 10 MG/ML IJ SOLN
INTRAMUSCULAR | Status: DC | PRN
  Administered 2024-11-09: 10 mg via INTRAVENOUS

## 2024-11-09 MED ORDER — ACETAMINOPHEN 10 MG/ML IV SOLN: 1000.0000 mg | Freq: Once | INTRAVENOUS | Status: DC | PRN

## 2024-11-09 MED ORDER — LIDOCAINE 2% (20 MG/ML) 5 ML SYRINGE
INTRAMUSCULAR | Status: AC
  Filled 2024-11-09: qty 5

## 2024-11-09 MED ORDER — LIDOCAINE 2% (20 MG/ML) 5 ML SYRINGE
INTRAMUSCULAR | Status: DC | PRN
  Administered 2024-11-09: 100 mg via INTRAVENOUS

## 2024-11-09 MED ORDER — KETOROLAC TROMETHAMINE 30 MG/ML IJ SOLN
INTRAMUSCULAR | Status: DC | PRN
  Administered 2024-11-09: 30 mg via INTRAVENOUS

## 2024-11-09 MED ORDER — DROPERIDOL 2.5 MG/ML IJ SOLN: 0.6250 mg | Freq: Once | INTRAMUSCULAR | Status: DC | PRN

## 2024-11-09 MED ORDER — ACETAMINOPHEN 10 MG/ML IV SOLN
INTRAVENOUS | Status: DC | PRN
  Administered 2024-11-09: 1000 mg via INTRAVENOUS

## 2024-11-09 MED ORDER — PROPOFOL 500 MG/50ML IV EMUL
INTRAVENOUS | Status: AC
  Filled 2024-11-09: qty 100

## 2024-11-09 MED ORDER — MAXITROL 3.5-10000-0.1 OP OINT: 1.0000 | TOPICAL_OINTMENT | Freq: Four times a day (QID) | OPHTHALMIC | Status: AC

## 2024-11-09 MED ORDER — PHENYLEPHRINE HCL 2.5 % OP SOLN
1.0000 [drp] | Freq: Once | OPHTHALMIC | Status: AC
  Administered 2024-11-09: 1 [drp] via OPHTHALMIC

## 2024-11-09 MED ORDER — FENTANYL CITRATE (PF) 100 MCG/2ML IJ SOLN
INTRAMUSCULAR | Status: AC
  Filled 2024-11-09: qty 2

## 2024-11-09 MED ORDER — PHENYLEPHRINE HCL 10 % OP SOLN: 1.0000 [drp] | Freq: Once | OPHTHALMIC | Status: DC

## 2024-11-09 MED ORDER — PHENYLEPHRINE HCL 2.5 % OP SOLN
OPHTHALMIC | Status: AC
  Filled 2024-11-09: qty 2

## 2024-11-09 MED ORDER — BSS IO SOLN
INTRAOCULAR | Status: DC | PRN
  Administered 2024-11-09: 15 mL

## 2024-11-09 MED ORDER — OXYCODONE HCL 5 MG PO TABS: 5.0000 mg | ORAL_TABLET | Freq: Once | ORAL | Status: DC | PRN

## 2024-11-09 MED ORDER — FENTANYL CITRATE (PF) 100 MCG/2ML IJ SOLN
INTRAMUSCULAR | Status: DC | PRN
  Administered 2024-11-09 (×2): 50 ug via INTRAVENOUS

## 2024-11-09 MED ORDER — NEOMYCIN-POLYMYXIN-DEXAMETH 3.5-10000-0.1 OP OINT
TOPICAL_OINTMENT | OPHTHALMIC | Status: AC
  Filled 2024-11-09: qty 3.5

## 2024-11-09 MED ORDER — PROPOFOL 10 MG/ML IV BOLUS
INTRAVENOUS | Status: DC | PRN
  Administered 2024-11-09: 200 mg via INTRAVENOUS

## 2024-11-09 MED ORDER — FENTANYL CITRATE (PF) 100 MCG/2ML IJ SOLN: 25.0000 ug | INTRAMUSCULAR | Status: DC | PRN

## 2024-11-09 MED ORDER — DEXMEDETOMIDINE HCL IN NACL 80 MCG/20ML IV SOLN
INTRAVENOUS | Status: AC
  Filled 2024-11-09: qty 20

## 2024-11-09 MED ORDER — MIDAZOLAM HCL 2 MG/2ML IJ SOLN
INTRAMUSCULAR | Status: AC
  Filled 2024-11-09: qty 2

## 2024-11-09 MED ORDER — PHENYLEPHRINE 80 MCG/ML (10ML) SYRINGE FOR IV PUSH (FOR BLOOD PRESSURE SUPPORT)
PREFILLED_SYRINGE | INTRAVENOUS | Status: DC | PRN
  Administered 2024-11-09: 80 ug via INTRAVENOUS

## 2024-11-09 MED ORDER — LACTATED RINGERS IV SOLN: INTRAVENOUS | Status: DC

## 2024-11-09 MED ORDER — ONDANSETRON HCL 4 MG/2ML IJ SOLN
INTRAMUSCULAR | Status: DC | PRN
  Administered 2024-11-09: 4 mg via INTRAVENOUS

## 2024-11-09 MED ORDER — OXYCODONE HCL 5 MG/5ML PO SOLN: 5.0000 mg | Freq: Once | ORAL | Status: DC | PRN

## 2024-11-09 MED ORDER — MIDAZOLAM HCL (PF) 2 MG/2ML IJ SOLN
INTRAMUSCULAR | Status: DC | PRN
  Administered 2024-11-09: 2 mg via INTRAVENOUS

## 2024-11-09 NOTE — Transfer of Care (Signed)
 Immediate Anesthesia Transfer of Care Note  Patient: Linda Marquez  Procedure(s) Performed: STRABISMUS REPAIR LEFT EYE (Left: Eye)  Patient Location: PACU  Anesthesia Type:General  Level of Consciousness: drowsy  Airway & Oxygen Therapy: Patient Spontanous Breathing and Patient connected to nasal cannula oxygen  Post-op Assessment: Report given to RN and Post -op Vital signs reviewed and stable  Post vital signs: Reviewed and stable  Last Vitals:  Vitals Value Taken Time  BP 136/70 11/09/24 11:25  Temp 36.6 C 11/09/24 11:25  Pulse 87 11/09/24 11:26  Resp 12 11/09/24 11:26  SpO2 93 % 11/09/24 11:26  Vitals shown include unfiled device data.  Last Pain:  Vitals:   11/09/24 0852  TempSrc: Temporal  PainSc: 0-No pain      Patients Stated Pain Goal: 0 (11/09/24 9147)  Complications: No notable events documented.

## 2024-11-09 NOTE — Op Note (Signed)
 11/09/2024  11:16 AM  PATIENT:  Grayce LITTIE Poisson 54 y.o. female   Preoperative diagnosis: 1. Left exotropia, sensory     2. History choroidal melanoma left eye, treated with plaque radiotherapy     3. Visual impairment left eye              Postoperative diagnosis: Same   Procedure: 1. Lateral rectus muscle recession 8.16mm left eye 2. Medial rectus muscle plication, 6.0 mm left eye   Surgeon: EMERSON Ronnald Blanch, M.D.   ANESTHESIA:  General LMA and subTenons Marcaine    COMPLICATIONS: None immediate   Description of procedure: After routine preoperative evaluation including informed consent, the patient was taken to the operating room where general anesthesia was induced without difficulty. The patient was prepped and draped in standard sterile fashion. Maxitrol  ointment was placed on both corneas for intraoperative protection. A lid speculum was placed in the left eye.   Through an inferotemporal fornix incision through scarred conjunctiva and scarred Tenons fascia, the left lateral rectus muscle was engaged on a series of muscle hooks and carefully cleared of its fascial attachments. It was scarred at the insertion, indicating prior surgery. The muscle was secured with a double-armed 6-0 Vicryl suture with a double locking bite at each border of the muscle 1mm posterior to the insertion. The muscle was disinserted and reattached to the sclera 8.2mm posterior to the original insertion, using crossed swords technique. The sutures were tied and found to be secured, then cut. Conjunctiva was closed with 7-0 chromic sutures.  Through an inferonasal fornix incision through scarred conjunctiva and scarred Tenons fascia, the left medial rectus muscle was engaged on a series of muscle hooks and carefully cleared of its fascial attachments. The muscle was secured with a double-armed 6-0 Vicryl suture with a double locking bite at each border of the muscle 6.36mm posterior to the insertion. Sutures were brought  forward and passed superior to the muscle, through the lateral portion of insertion and exiting the medial portion of the insertion, and back through the medial aspect of the muscle 6.35mm posterior to the insertion with special care taken to prevent severing of the posterior suture loop. Gentle traction was placed on the sutures with concurrent posterior motion to the muscle, resulting in a muscle loop posterior to the suture and a resulting 6.32mm shortening of the left medial rectus muscle. The suture ends were securely tied and cut. 1.5mL of bupivacaine  was instilled into the subTenons space and conjunctiva was closed with 2 6-0 chromic sutures.     Two drops of dilute betadine were placed on the eye and rinsed after ten seconds; Maxitrol  ointment was placed in the eye.   The patient was awakened without difficulty and taken to the recovery room in stable condition, having suffered no intraoperative or immediate postoperative complications.   EMERSON Ronnald Blanch, M.D.

## 2024-11-09 NOTE — Anesthesia Postprocedure Evaluation (Signed)
"   Anesthesia Post Note  Patient: Linda Marquez  Procedure(s) Performed: STRABISMUS REPAIR LEFT EYE (Left: Eye)     Patient location during evaluation: PACU Anesthesia Type: General Level of consciousness: awake and alert Pain management: pain level controlled Vital Signs Assessment: post-procedure vital signs reviewed and stable Respiratory status: spontaneous breathing, nonlabored ventilation, respiratory function stable and patient connected to nasal cannula oxygen Cardiovascular status: blood pressure returned to baseline and stable Postop Assessment: no apparent nausea or vomiting Anesthetic complications: no   No notable events documented.  Last Vitals:  Vitals:   11/09/24 1145 11/09/24 1201  BP: (!) 141/84 (!) 149/87  Pulse: 86 81  Resp: 13 16  Temp:  36.6 C  SpO2: 95% 94%    Last Pain:  Vitals:   11/09/24 1201  TempSrc: Temporal  PainSc: 0-No pain                 [name]      "

## 2024-11-09 NOTE — Discharge Instructions (Addendum)
 Diet: Clear liquids, advance to soft foods then regular diet as tolerated.  Pain control: Overlapping ibuprofen (aka Advil, Motrin) with acetaminophen  (aka Tylenol , Excedrin) has been clinically proven as effective for pain as morphine.  1)  Ibuprofen 600 mg by mouth every 6 hours as needed for pain   Do not take this medication if you already take NSAIDs (such as naproxen/Aleve or Surveyor, Quantity). No Ibuprofen until after 5:15pm today.  2)  Acetaminophen  325 one or two by mouth every 4-6 hours as needed for pain that is not resolved by ibuprofen  Do not take this medication if you already take acetaminophen  within another medication (such as Percocet/Lortab). No Tylenol  until after 5:00pm today.  Eye medications:  Antibiotic eye drops or ointment, one drop or application in the operated eye(s) 4 times a day for 10 days.    Activity: No swimming for 1 week. It is OK to let water run over the face and eyes while showering or taking a bath, even during the first week.  No other restriction on exercise or activity.  Eye movement: The eyes may look very slightly crossed in or turned out, and you may experience temporary double vision (diplopia). This is not unusual postoperatively and may happen up to two months after surgery while the muscles are healing. The eyes may be tired during the first few weeks after surgery; reading can be uncomfortable during the healing process but will not hurt the eyes.  Call Dr. Anthony office [phone] with any problems or concerns.    Post Anesthesia Home Care Instructions  Activity: Get plenty of rest for the remainder of the day. A responsible individual must stay with you for 24 hours following the procedure.  For the next 24 hours, DO NOT: -Drive a car -Advertising copywriter -Drink alcoholic beverages -Take any medication unless instructed by your physician -Make any legal decisions or sign important papers.  Meals: Start with liquid foods such as  gelatin or soup. Progress to regular foods as tolerated. Avoid greasy, spicy, heavy foods. If nausea and/or vomiting occur, drink only clear liquids until the nausea and/or vomiting subsides. Call your physician if vomiting continues.  Special Instructions/Symptoms: Your throat may feel dry or sore from the anesthesia or the breathing tube placed in your throat during surgery. If this causes discomfort, gargle with warm salt water. The discomfort should disappear within 24 hours.  If you had a scopolamine patch placed behind your ear for the management of post- operative nausea and/or vomiting:  1. The medication in the patch is effective for 72 hours, after which it should be removed.  Wrap patch in a tissue and discard in the trash. Wash hands thoroughly with soap and water. 2. You may remove the patch earlier than 72 hours if you experience unpleasant side effects which may include dry mouth, dizziness or visual disturbances. 3. Avoid touching the patch. Wash your hands with soap and water after contact with the patch.

## 2024-11-09 NOTE — Anesthesia Procedure Notes (Signed)
 Procedure Name: LMA Insertion Date/Time: 11/09/2024 10:31 AM  Performed by: Lenard Lacks, CRNAPre-anesthesia Checklist: Patient identified, Emergency Drugs available, Suction available and Patient being monitored Patient Re-evaluated:Patient Re-evaluated prior to induction Oxygen Delivery Method: Circle system utilized Preoxygenation: Pre-oxygenation with 100% oxygen Induction Type: IV induction LMA: LMA flexible inserted LMA Size: 4.0 Number of attempts: 1 Placement Confirmation: positive ETCO2 and breath sounds checked- equal and bilateral Dental Injury: Teeth and Oropharynx as per pre-operative assessment

## 2024-11-09 NOTE — Anesthesia Preprocedure Evaluation (Addendum)
"                                    Anesthesia Evaluation  Patient identified by MRN, date of birth, ID band Patient awake    Reviewed: Allergy & Precautions, H&P , NPO status , Patient's Chart, lab work & pertinent test results  History of Anesthesia Complications Negative for: history of anesthetic complications  Airway Mallampati: II  TM Distance: >3 FB Neck ROM: Full    Dental no notable dental hx.    Pulmonary neg pulmonary ROS   Pulmonary exam normal breath sounds clear to auscultation       Cardiovascular hypertension, Normal cardiovascular exam Rhythm:Regular Rate:Normal     Neuro/Psych negative neurological ROS  negative psych ROS   GI/Hepatic negative GI ROS, Neg liver ROS,,,  Endo/Other  Hypothyroidism    Renal/GU negative Renal ROS  negative genitourinary   Musculoskeletal negative musculoskeletal ROS (+)    Abdominal   Peds negative pediatric ROS (+)  Hematology negative hematology ROS (+)   Anesthesia Other Findings Strabismus left eye Ocular melanoma  Reproductive/Obstetrics negative OB ROS                              Anesthesia Physical Anesthesia Plan  ASA: 2  Anesthesia Plan: General   Post-op Pain Management:    Induction: Intravenous  PONV Risk Score and Plan: 3 and Ondansetron , Dexamethasone , Midazolam  and Treatment may vary due to age or medical condition  Airway Management Planned: Oral ETT  Additional Equipment: None  Intra-op Plan:   Post-operative Plan: Extubation in OR  Informed Consent: I have reviewed the patients History and Physical, chart, labs and discussed the procedure including the risks, benefits and alternatives for the proposed anesthesia with the patient or authorized representative who has indicated his/her understanding and acceptance.     Dental advisory given  Plan Discussed with: CRNA  Anesthesia Plan Comments:          Anesthesia Quick  Evaluation  "

## 2024-11-09 NOTE — H&P (Signed)
 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 OS is light perception vision secondary to history and now has large-angle constant exotropia.  Pertinent family history:  Family History  Problem Relation Age of Onset   Diabetes Mother    Hypertension Mother    Breast cancer Mother    Breast cancer Maternal Grandmother    Cataracts Maternal Grandfather     General:  Healthy appearing patient in no distress.    Eyes:    Acuity OD 20/20  OS 20/LP  cc  External: Within normal limits     Anterior segment: Within normal limits     Motility:   45pd LXT  Impression: 53yoF with sensory exotropia left eye desiring repair  Plan: strabismic repair left eye  M. Ronnald Blanch, MD
